# Patient Record
Sex: Male | Born: 1950 | Race: Black or African American | Hispanic: No | Marital: Married | Smoking: Current some day smoker
Health system: Southern US, Community
[De-identification: ages and names within clinical notes are randomized; demographics above are authoritative.]

## PROBLEM LIST (undated history)

## (undated) DIAGNOSIS — B019 Varicella without complication: Secondary | ICD-10-CM

## (undated) DIAGNOSIS — E785 Hyperlipidemia, unspecified: Secondary | ICD-10-CM

## (undated) DIAGNOSIS — C801 Malignant (primary) neoplasm, unspecified: Secondary | ICD-10-CM

## (undated) DIAGNOSIS — J449 Chronic obstructive pulmonary disease, unspecified: Secondary | ICD-10-CM

## (undated) DIAGNOSIS — I10 Essential (primary) hypertension: Secondary | ICD-10-CM

## (undated) HISTORY — DX: Essential (primary) hypertension: I10

## (undated) HISTORY — DX: Hyperlipidemia, unspecified: E78.5

## (undated) HISTORY — DX: Varicella without complication: B01.9

## (undated) HISTORY — DX: Chronic obstructive pulmonary disease, unspecified: J44.9

---

## 2004-10-03 ENCOUNTER — Ambulatory Visit: Payer: Self-pay | Admitting: Gastroenterology

## 2004-10-10 ENCOUNTER — Ambulatory Visit: Payer: Self-pay | Admitting: Gastroenterology

## 2004-10-20 ENCOUNTER — Ambulatory Visit: Payer: Self-pay | Admitting: Gastroenterology

## 2005-05-09 ENCOUNTER — Emergency Department (HOSPITAL_COMMUNITY): Admission: EM | Admit: 2005-05-09 | Discharge: 2005-05-09 | Payer: Self-pay | Admitting: Emergency Medicine

## 2007-09-18 HISTORY — PX: COLONOSCOPY: SHX174

## 2008-07-30 ENCOUNTER — Encounter: Admission: RE | Admit: 2008-07-30 | Discharge: 2008-07-30 | Payer: Self-pay | Admitting: Internal Medicine

## 2008-07-30 IMAGING — CT CT ABDOMEN WO/W CM
2 of 6 series · 13 of 46 positions shown, 18 images · IV contrast (READICAT/WATER & [ID] OMNI 300)
Comparison: None

CT ABDOMEN

CLINICAL DATA: History of pancreatic carcinoma, however the
patient gives no history of carcinoma. Now with weight loss and
left lower quadrant pain

CT ABDOMEN WITHOUT AND WITH CONTRAST
CT PELVIS WITH CONTRAST
TECHNIQUE: Multidetector CT imaging of the abdomen was performed
initially following the standard protocol before administration of
intravenous contrast.  Multidetector CT imaging of the abdomen and
pelvis was then performed following the standard protocol during
the bolus injection of intravenous contrast.
Contrast: 100 ml [KJ]

[Series 6: renal delay · axial · delayed · 0.70mm/px · z∈[-224,-74]mm · 10 of 36 slices shown, 15 images]
[im 3/36  soft-tissue]
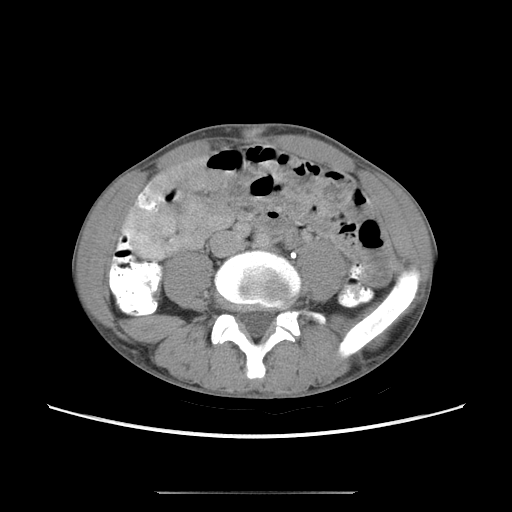
[im 3/36  bone]
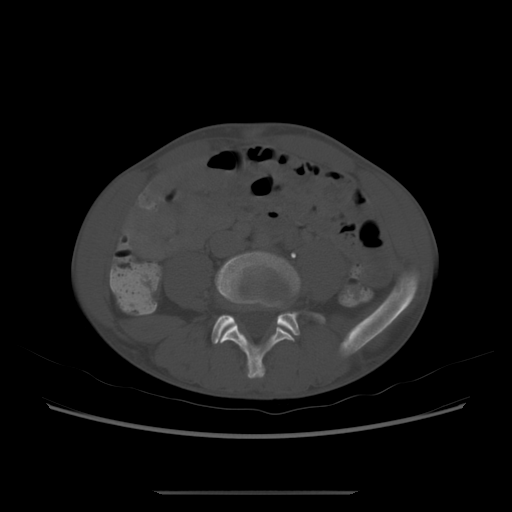
[im 8/36  soft-tissue]
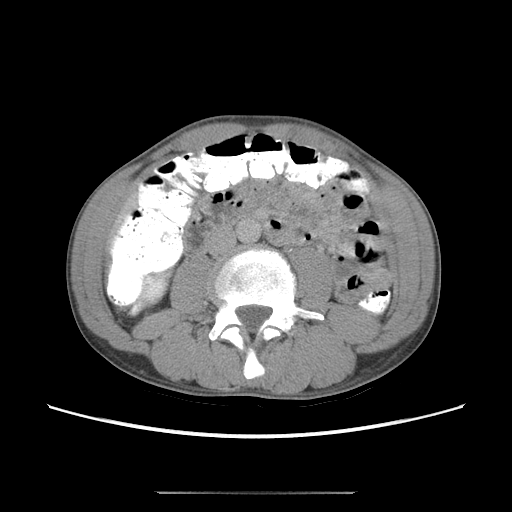
[im 10/36  soft-tissue]
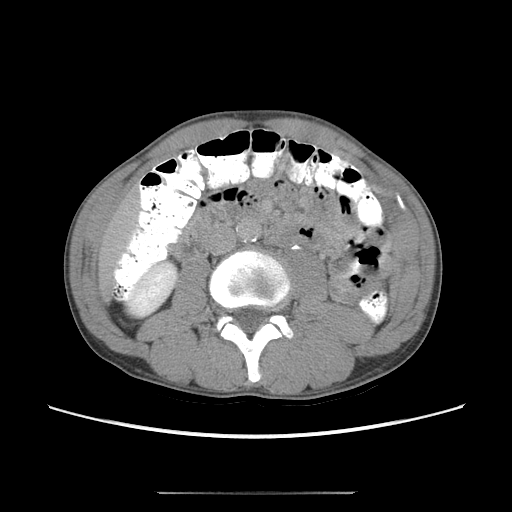
[im 15/36  soft-tissue]
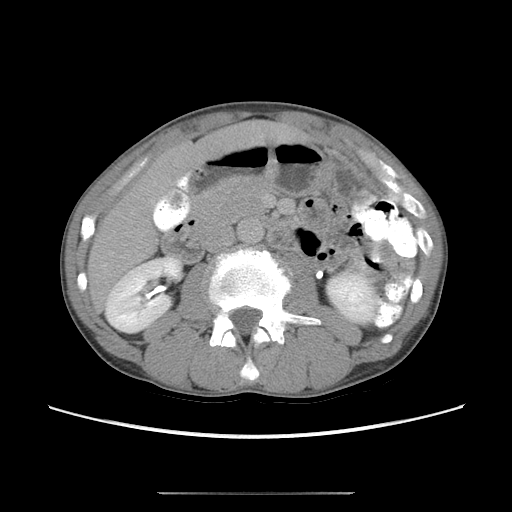
[im 19/36  soft-tissue]
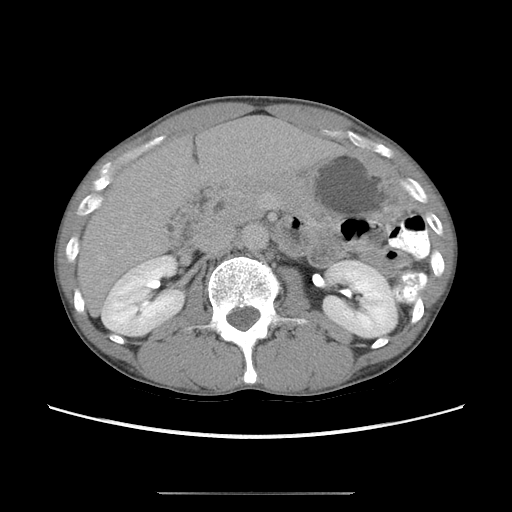
[im 22/36  soft-tissue]
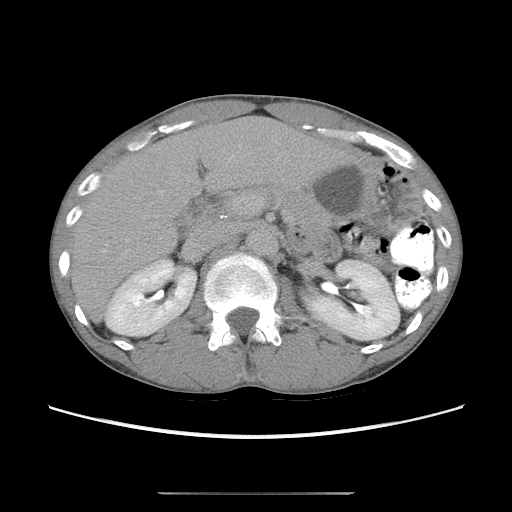
[im 26/36  soft-tissue]
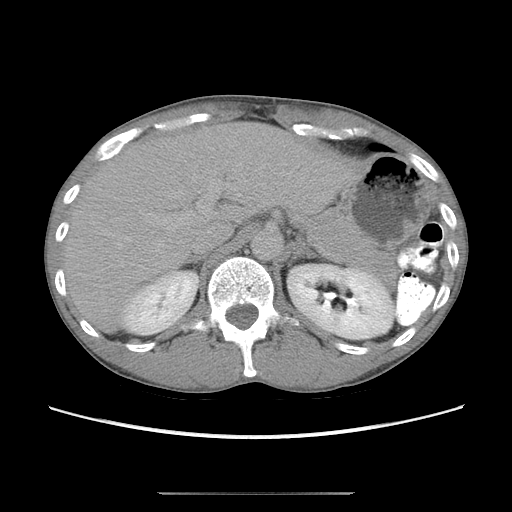
[im 26/36  lung]
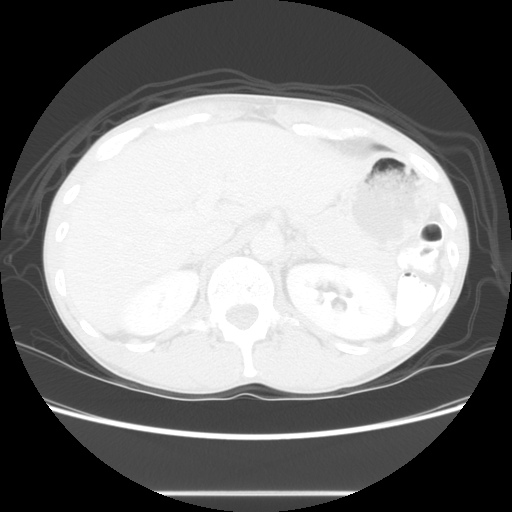
[im 29/36  soft-tissue]
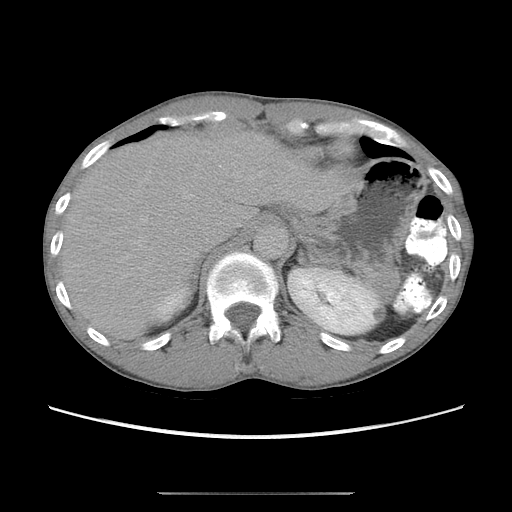
[im 29/36  lung]
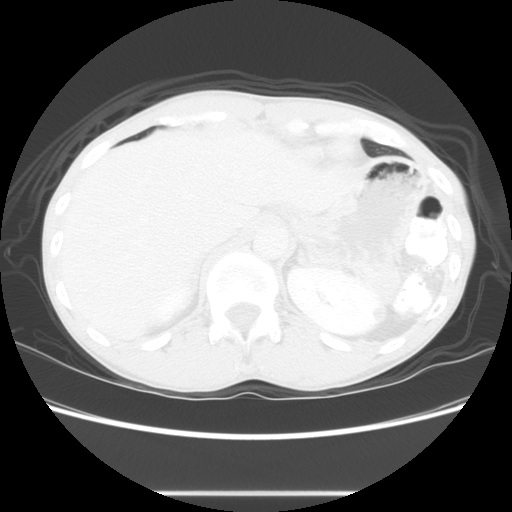
[im 31/36  lung]
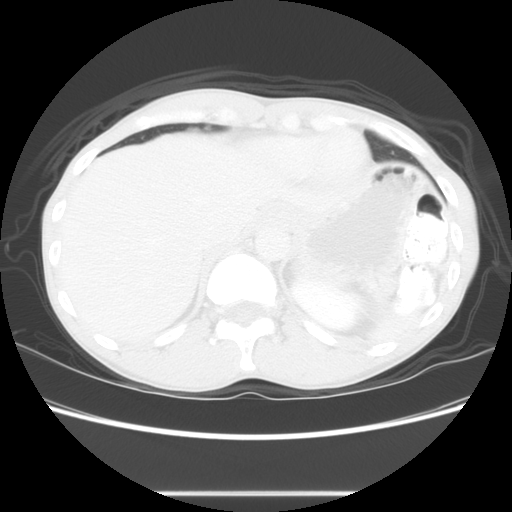
[im 33/36  soft-tissue]
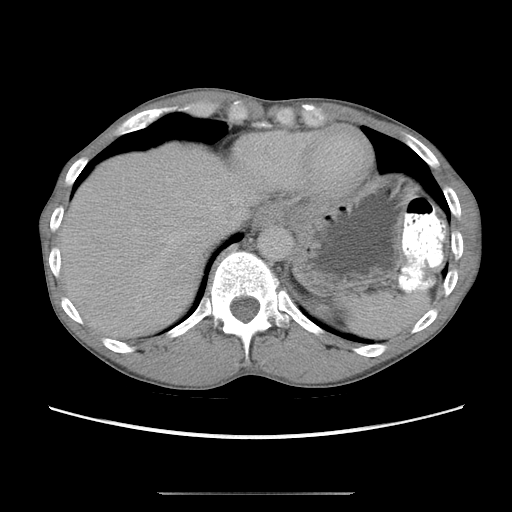
[im 33/36  lung]
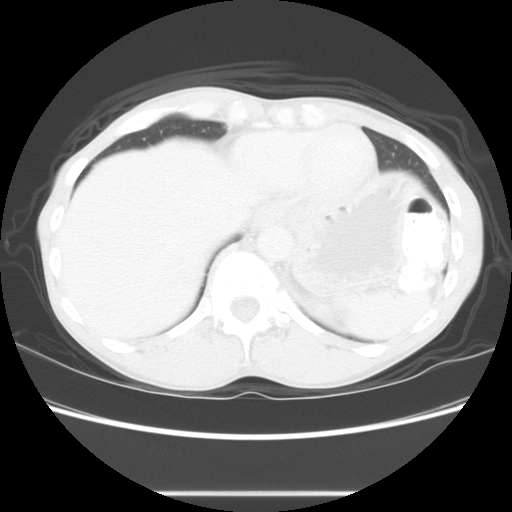
[im 33/36  bone]
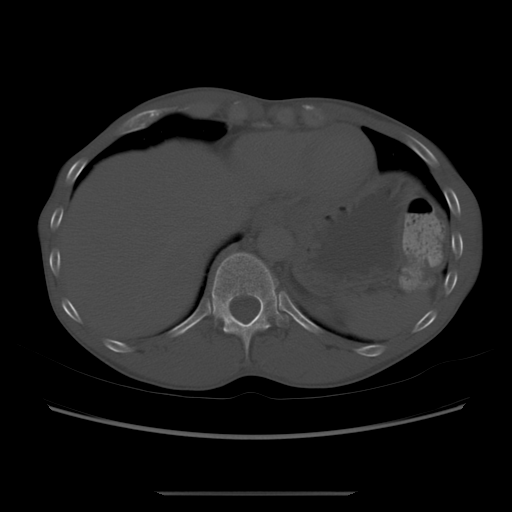

[Series 602: sagittal body · sagittal · 0.53mm/px · 3 of 109 slices shown]
[im 28/109  soft-tissue]
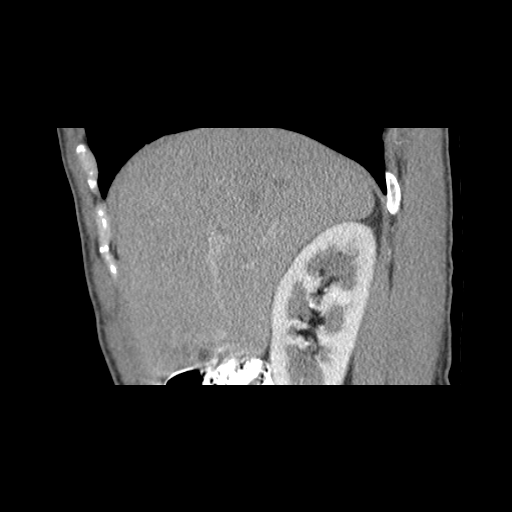
[im 55/109  soft-tissue]
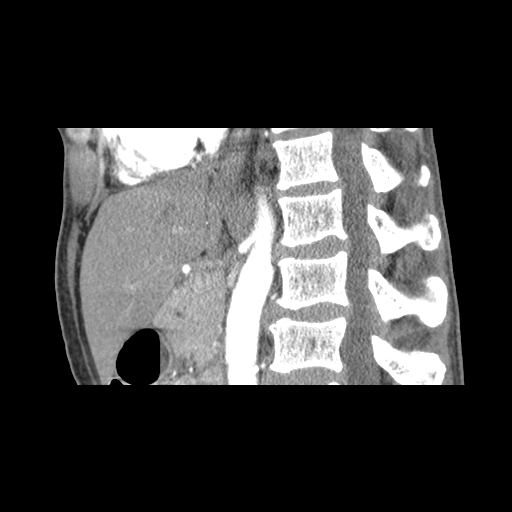
[im 82/109  soft-tissue]
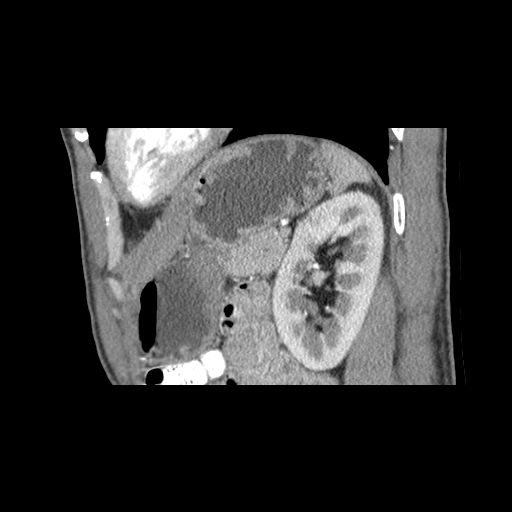

[13 of 46 positions shown; findings below may reference images not displayed]

FINDINGS: The lung bases are clear.  On the unenhanced study no
definite renal calculi are seen.

On the arterial phase the pancreas enhances homogeneously with no
focal abnormality.  The pancreatic duct is within normal limits in
size.  No pancreatic mass is seen.  The origins of the celiac axis,
SMA, both renal arteries and IMA appear patent.

On the portal venous phase the liver enhances with no suspicious
abnormality. The gallbladder is very contracted and elongated. The
pancreas again is homogeneous with no focal abnormality.  The
adrenal glands and spleen are within normal limits.  The kidneys
enhance and on delayed images the pelvocaliceal systems appear
normal.  There is apparent small cyst in the upper pole of the left
kidney posterolaterally.  The abdominal aorta is normal in caliber.
No adenopathy is seen.
IMPRESSION: 1.  No abnormality of the pancreas is seen.  No mass is noted and
there is no evidence of ductal dilatation.
2.  Gallbladder is contracted and elongated.

CT PELVIS
FINDINGS: There is very little peritoneal and retroperitoneal fat
present.  The appendix and terminal ileum appear normal.  The
urinary bladder is moderately well seen with no abnormality noted.
The prostate is normal in size for age.  No pelvic mass, fluid, or
adenopathy is seen.  No bony abnormality is noted.
IMPRESSION: Negative CT of the pelvis.

## 2008-09-15 ENCOUNTER — Ambulatory Visit (HOSPITAL_COMMUNITY): Admission: RE | Admit: 2008-09-15 | Discharge: 2008-09-15 | Payer: Self-pay | Admitting: *Deleted

## 2008-09-15 ENCOUNTER — Encounter (INDEPENDENT_AMBULATORY_CARE_PROVIDER_SITE_OTHER): Payer: Self-pay | Admitting: *Deleted

## 2009-08-24 DIAGNOSIS — D649 Anemia, unspecified: Secondary | ICD-10-CM | POA: Insufficient documentation

## 2009-08-24 DIAGNOSIS — R634 Abnormal weight loss: Secondary | ICD-10-CM | POA: Insufficient documentation

## 2009-08-25 ENCOUNTER — Ambulatory Visit: Payer: Self-pay | Admitting: Gastroenterology

## 2009-08-25 DIAGNOSIS — R1319 Other dysphagia: Secondary | ICD-10-CM

## 2009-08-25 DIAGNOSIS — K649 Unspecified hemorrhoids: Secondary | ICD-10-CM | POA: Insufficient documentation

## 2009-08-25 DIAGNOSIS — J4489 Other specified chronic obstructive pulmonary disease: Secondary | ICD-10-CM | POA: Insufficient documentation

## 2009-08-25 DIAGNOSIS — K219 Gastro-esophageal reflux disease without esophagitis: Secondary | ICD-10-CM

## 2009-08-25 DIAGNOSIS — J449 Chronic obstructive pulmonary disease, unspecified: Secondary | ICD-10-CM

## 2009-08-25 DIAGNOSIS — F1010 Alcohol abuse, uncomplicated: Secondary | ICD-10-CM | POA: Insufficient documentation

## 2009-08-26 LAB — CONVERTED CEMR LAB
Amylase: 293 units/L — ABNORMAL HIGH (ref 27–131)
INR: 1.1 — ABNORMAL HIGH (ref 0.8–1.0)
Iron: 132 ug/dL (ref 42–165)
Lipase: 33 units/L (ref 11.0–59.0)
Prothrombin Time: 11.2 s (ref 9.1–11.7)

## 2009-08-29 ENCOUNTER — Ambulatory Visit: Payer: Self-pay | Admitting: Gastroenterology

## 2011-01-30 NOTE — Op Note (Signed)
NAMEARSENIY, TOOMEY           ACCOUNT NO.:  [id number]   MEDICAL RECORD NO.:  [id number]          PATIENT TYPE:  AMB   LOCATION:  ENDO                         FACILITY:  [facility]   PHYSICIAN:  Georgiana Spinner, M.D.    DATE OF BIRTH:  01/16/1951   DATE OF PROCEDURE:  09/15/2008  DATE OF DISCHARGE:                               OPERATIVE REPORT   PROCEDURE:  Colonoscopy.   INDICATIONS:  Colon cancer screening.   ANESTHESIA:  Fentanyl 75 mcg, Versed 5 mg.   PROCEDURE:  With the patient mildly sedated in the left lateral  decubitus position, the Pentax videoscopic pediatric colonoscope was  inserted in the rectum and passed under direct vision with pressure  applied and the patient rolled to his back.  We passed through a very  tortuous sigmoid colon to reach the cecum identified by ileocecal valve  and appendiceal orifice, both of which were photographed.  From this  point, the colonoscope was slowly withdrawn, taking circumferential  views of colonic mucosa, stopping only in the rectum which appeared  normal on direct and retroflexed view.  The endoscope was straightened  and withdrawn.  The patient's vital signs and pulse oximeter remained  stable.  The patient tolerated the procedure well without apparent  complications.   FINDINGS:  Unremarkable examination but very tortuous colon.   PLAN:  Repeat examination in 5 or 10 years.           ______________________________  Georgiana Spinner, M.D.     GMO/MEDQ  D:  09/15/2008  T:  09/15/2008  Job:  [id number]

## 2015-05-19 ENCOUNTER — Encounter: Payer: Self-pay | Admitting: Gastroenterology

## 2016-05-02 DIAGNOSIS — N401 Enlarged prostate with lower urinary tract symptoms: Secondary | ICD-10-CM | POA: Diagnosis not present

## 2016-05-02 DIAGNOSIS — R350 Frequency of micturition: Secondary | ICD-10-CM | POA: Diagnosis not present

## 2016-05-02 DIAGNOSIS — N5201 Erectile dysfunction due to arterial insufficiency: Secondary | ICD-10-CM | POA: Diagnosis not present

## 2017-10-16 DIAGNOSIS — D649 Anemia, unspecified: Secondary | ICD-10-CM | POA: Diagnosis not present

## 2017-10-16 DIAGNOSIS — G629 Polyneuropathy, unspecified: Secondary | ICD-10-CM | POA: Diagnosis not present

## 2017-10-16 DIAGNOSIS — Z125 Encounter for screening for malignant neoplasm of prostate: Secondary | ICD-10-CM | POA: Diagnosis not present

## 2017-10-16 DIAGNOSIS — I739 Peripheral vascular disease, unspecified: Secondary | ICD-10-CM | POA: Diagnosis not present

## 2017-11-02 DIAGNOSIS — D649 Anemia, unspecified: Secondary | ICD-10-CM | POA: Diagnosis not present

## 2017-11-02 DIAGNOSIS — I7300 Raynaud's syndrome without gangrene: Secondary | ICD-10-CM | POA: Diagnosis not present

## 2017-11-02 DIAGNOSIS — G629 Polyneuropathy, unspecified: Secondary | ICD-10-CM | POA: Diagnosis not present

## 2017-11-27 DIAGNOSIS — M130 Polyarthritis, unspecified: Secondary | ICD-10-CM | POA: Diagnosis not present

## 2017-11-27 DIAGNOSIS — G629 Polyneuropathy, unspecified: Secondary | ICD-10-CM | POA: Diagnosis not present

## 2017-11-27 DIAGNOSIS — I7300 Raynaud's syndrome without gangrene: Secondary | ICD-10-CM | POA: Diagnosis not present

## 2017-11-27 DIAGNOSIS — D649 Anemia, unspecified: Secondary | ICD-10-CM | POA: Diagnosis not present

## 2017-12-27 DIAGNOSIS — I739 Peripheral vascular disease, unspecified: Secondary | ICD-10-CM | POA: Diagnosis not present

## 2017-12-27 DIAGNOSIS — I7300 Raynaud's syndrome without gangrene: Secondary | ICD-10-CM | POA: Diagnosis not present

## 2017-12-27 DIAGNOSIS — Z0000 Encounter for general adult medical examination without abnormal findings: Secondary | ICD-10-CM | POA: Diagnosis not present

## 2017-12-27 DIAGNOSIS — M130 Polyarthritis, unspecified: Secondary | ICD-10-CM | POA: Diagnosis not present

## 2017-12-27 DIAGNOSIS — G629 Polyneuropathy, unspecified: Secondary | ICD-10-CM | POA: Diagnosis not present

## 2018-02-06 DIAGNOSIS — R21 Rash and other nonspecific skin eruption: Secondary | ICD-10-CM | POA: Diagnosis not present

## 2018-02-06 DIAGNOSIS — R29898 Other symptoms and signs involving the musculoskeletal system: Secondary | ICD-10-CM | POA: Diagnosis not present

## 2018-02-06 DIAGNOSIS — Z6820 Body mass index (BMI) 20.0-20.9, adult: Secondary | ICD-10-CM | POA: Diagnosis not present

## 2018-02-06 DIAGNOSIS — M2550 Pain in unspecified joint: Secondary | ICD-10-CM | POA: Diagnosis not present

## 2018-02-06 DIAGNOSIS — I7300 Raynaud's syndrome without gangrene: Secondary | ICD-10-CM | POA: Diagnosis not present

## 2018-02-07 DIAGNOSIS — R21 Rash and other nonspecific skin eruption: Secondary | ICD-10-CM | POA: Diagnosis not present

## 2018-02-07 DIAGNOSIS — I7300 Raynaud's syndrome without gangrene: Secondary | ICD-10-CM | POA: Diagnosis not present

## 2018-02-07 DIAGNOSIS — F17210 Nicotine dependence, cigarettes, uncomplicated: Secondary | ICD-10-CM | POA: Diagnosis not present

## 2018-02-07 DIAGNOSIS — G629 Polyneuropathy, unspecified: Secondary | ICD-10-CM | POA: Diagnosis not present

## 2018-02-07 DIAGNOSIS — M130 Polyarthritis, unspecified: Secondary | ICD-10-CM | POA: Diagnosis not present

## 2019-10-05 DIAGNOSIS — Z03818 Encounter for observation for suspected exposure to other biological agents ruled out: Secondary | ICD-10-CM | POA: Diagnosis not present

## 2021-01-24 DIAGNOSIS — Z791 Long term (current) use of non-steroidal anti-inflammatories (NSAID): Secondary | ICD-10-CM | POA: Diagnosis not present

## 2021-01-24 DIAGNOSIS — M1990 Unspecified osteoarthritis, unspecified site: Secondary | ICD-10-CM | POA: Diagnosis not present

## 2021-01-24 DIAGNOSIS — R269 Unspecified abnormalities of gait and mobility: Secondary | ICD-10-CM | POA: Diagnosis not present

## 2021-01-24 DIAGNOSIS — Z87891 Personal history of nicotine dependence: Secondary | ICD-10-CM | POA: Diagnosis not present

## 2021-01-24 DIAGNOSIS — Z7722 Contact with and (suspected) exposure to environmental tobacco smoke (acute) (chronic): Secondary | ICD-10-CM | POA: Diagnosis not present

## 2021-01-24 DIAGNOSIS — G8929 Other chronic pain: Secondary | ICD-10-CM | POA: Diagnosis not present

## 2021-01-24 DIAGNOSIS — I10 Essential (primary) hypertension: Secondary | ICD-10-CM | POA: Diagnosis not present

## 2021-01-24 DIAGNOSIS — Z8249 Family history of ischemic heart disease and other diseases of the circulatory system: Secondary | ICD-10-CM | POA: Diagnosis not present

## 2021-04-06 ENCOUNTER — Ambulatory Visit (INDEPENDENT_AMBULATORY_CARE_PROVIDER_SITE_OTHER): Payer: Medicare HMO

## 2021-04-06 ENCOUNTER — Other Ambulatory Visit: Payer: Self-pay

## 2021-04-06 ENCOUNTER — Encounter: Payer: Self-pay | Admitting: Internal Medicine

## 2021-04-06 ENCOUNTER — Ambulatory Visit (INDEPENDENT_AMBULATORY_CARE_PROVIDER_SITE_OTHER): Payer: Medicare HMO | Admitting: Internal Medicine

## 2021-04-06 VITALS — BP 152/90 | HR 51 | Temp 98.6°F | Resp 16 | Ht 65.0 in | Wt 119.0 lb

## 2021-04-06 DIAGNOSIS — R058 Other specified cough: Secondary | ICD-10-CM

## 2021-04-06 DIAGNOSIS — E785 Hyperlipidemia, unspecified: Secondary | ICD-10-CM

## 2021-04-06 DIAGNOSIS — Z0001 Encounter for general adult medical examination with abnormal findings: Secondary | ICD-10-CM

## 2021-04-06 DIAGNOSIS — N401 Enlarged prostate with lower urinary tract symptoms: Secondary | ICD-10-CM | POA: Insufficient documentation

## 2021-04-06 DIAGNOSIS — R972 Elevated prostate specific antigen [PSA]: Secondary | ICD-10-CM | POA: Diagnosis not present

## 2021-04-06 DIAGNOSIS — Z1159 Encounter for screening for other viral diseases: Secondary | ICD-10-CM | POA: Diagnosis not present

## 2021-04-06 DIAGNOSIS — Z23 Encounter for immunization: Secondary | ICD-10-CM | POA: Diagnosis not present

## 2021-04-06 DIAGNOSIS — R053 Chronic cough: Secondary | ICD-10-CM | POA: Diagnosis not present

## 2021-04-06 DIAGNOSIS — R21 Rash and other nonspecific skin eruption: Secondary | ICD-10-CM

## 2021-04-06 DIAGNOSIS — J411 Mucopurulent chronic bronchitis: Secondary | ICD-10-CM

## 2021-04-06 DIAGNOSIS — R350 Frequency of micturition: Secondary | ICD-10-CM | POA: Diagnosis not present

## 2021-04-06 DIAGNOSIS — E559 Vitamin D deficiency, unspecified: Secondary | ICD-10-CM

## 2021-04-06 DIAGNOSIS — R001 Bradycardia, unspecified: Secondary | ICD-10-CM

## 2021-04-06 DIAGNOSIS — I739 Peripheral vascular disease, unspecified: Secondary | ICD-10-CM

## 2021-04-06 DIAGNOSIS — G629 Polyneuropathy, unspecified: Secondary | ICD-10-CM | POA: Diagnosis not present

## 2021-04-06 DIAGNOSIS — Z720 Tobacco use: Secondary | ICD-10-CM | POA: Insufficient documentation

## 2021-04-06 DIAGNOSIS — R059 Cough, unspecified: Secondary | ICD-10-CM | POA: Diagnosis not present

## 2021-04-06 DIAGNOSIS — Z1211 Encounter for screening for malignant neoplasm of colon: Secondary | ICD-10-CM

## 2021-04-06 DIAGNOSIS — I10 Essential (primary) hypertension: Secondary | ICD-10-CM | POA: Diagnosis not present

## 2021-04-06 DIAGNOSIS — J449 Chronic obstructive pulmonary disease, unspecified: Secondary | ICD-10-CM | POA: Diagnosis not present

## 2021-04-06 LAB — HEPATIC FUNCTION PANEL
ALT: 9 U/L (ref 0–53)
AST: 20 U/L (ref 0–37)
Albumin: 4.3 g/dL (ref 3.5–5.2)
Alkaline Phosphatase: 81 U/L (ref 39–117)
Bilirubin, Direct: 0.1 mg/dL (ref 0.0–0.3)
Total Bilirubin: 0.5 mg/dL (ref 0.2–1.2)
Total Protein: 7.2 g/dL (ref 6.0–8.3)

## 2021-04-06 LAB — BASIC METABOLIC PANEL
BUN: 12 mg/dL (ref 6–23)
CO2: 29 mEq/L (ref 19–32)
Calcium: 9.6 mg/dL (ref 8.4–10.5)
Chloride: 103 mEq/L (ref 96–112)
Creatinine, Ser: 0.96 mg/dL (ref 0.40–1.50)
GFR: 80.39 mL/min (ref >60.00)
Glucose, Bld: 79 mg/dL (ref 70–99)
Potassium: 4.4 mEq/L (ref 3.5–5.1)
Sodium: 141 mEq/L (ref 135–145)

## 2021-04-06 LAB — CBC WITH DIFFERENTIAL/PLATELET
Basophils Absolute: 0 10*3/uL (ref 0.0–0.1)
Basophils Relative: 0.8 % (ref 0.0–3.0)
Eosinophils Absolute: 0.1 10*3/uL (ref 0.0–0.7)
Eosinophils Relative: 4.5 % (ref 0.0–5.0)
HCT: 39.9 % (ref 39.0–52.0)
Hemoglobin: 13 g/dL (ref 13.0–17.0)
Lymphocytes Relative: 35.9 % (ref 12.0–46.0)
Lymphs Abs: 1.1 10*3/uL (ref 0.7–4.0)
MCHC: 32.7 g/dL (ref 30.0–36.0)
MCV: 95.8 fl (ref 78.0–100.0)
Monocytes Absolute: 0.3 10*3/uL (ref 0.1–1.0)
Monocytes Relative: 8.7 % (ref 3.0–12.0)
Neutro Abs: 1.5 10*3/uL (ref 1.4–7.7)
Neutrophils Relative %: 50.1 % (ref 43.0–77.0)
Platelets: 232 10*3/uL (ref 150.0–400.0)
RBC: 4.16 Mil/uL — ABNORMAL LOW (ref 4.22–5.81)
RDW: 13.5 % (ref 11.5–15.5)
WBC: 3.1 10*3/uL — ABNORMAL LOW (ref 4.0–10.5)

## 2021-04-06 LAB — LIPID PANEL
Cholesterol: 112 mg/dL (ref 0–200)
HDL: 54 mg/dL (ref >39.00)
LDL Cholesterol: 37 mg/dL (ref 0–99)
NonHDL: 57.97
Total CHOL/HDL Ratio: 2
Triglycerides: 105 mg/dL (ref 0.0–149.0)
VLDL: 21 mg/dL (ref 0.0–40.0)

## 2021-04-06 LAB — URINALYSIS, ROUTINE W REFLEX MICROSCOPIC
Bilirubin Urine: NEGATIVE
Ketones, ur: NEGATIVE
Leukocytes,Ua: NEGATIVE
Nitrite: POSITIVE — AB
Specific Gravity, Urine: 1.02 (ref 1.000–1.030)
Total Protein, Urine: NEGATIVE
Urine Glucose: NEGATIVE
Urobilinogen, UA: 1 (ref 0.0–1.0)
pH: 6.5 (ref 5.0–8.0)

## 2021-04-06 LAB — VITAMIN D 25 HYDROXY (VIT D DEFICIENCY, FRACTURES): VITD: 14.23 ng/mL — ABNORMAL LOW (ref 30.00–100.00)

## 2021-04-06 LAB — PSA: PSA: 6.1 ng/mL — ABNORMAL HIGH (ref 0.10–4.00)

## 2021-04-06 IMAGING — DX DG CHEST 2V
3 series · 3 of 3 positions shown · non-contrast
Comparison: CT abdomen [DATE]

CLINICAL DATA: Chronic cough, smoker, increased heart rate, no
known injury

EXAM:
CHEST - 2 VIEW

[chest pa (1 of 2)]
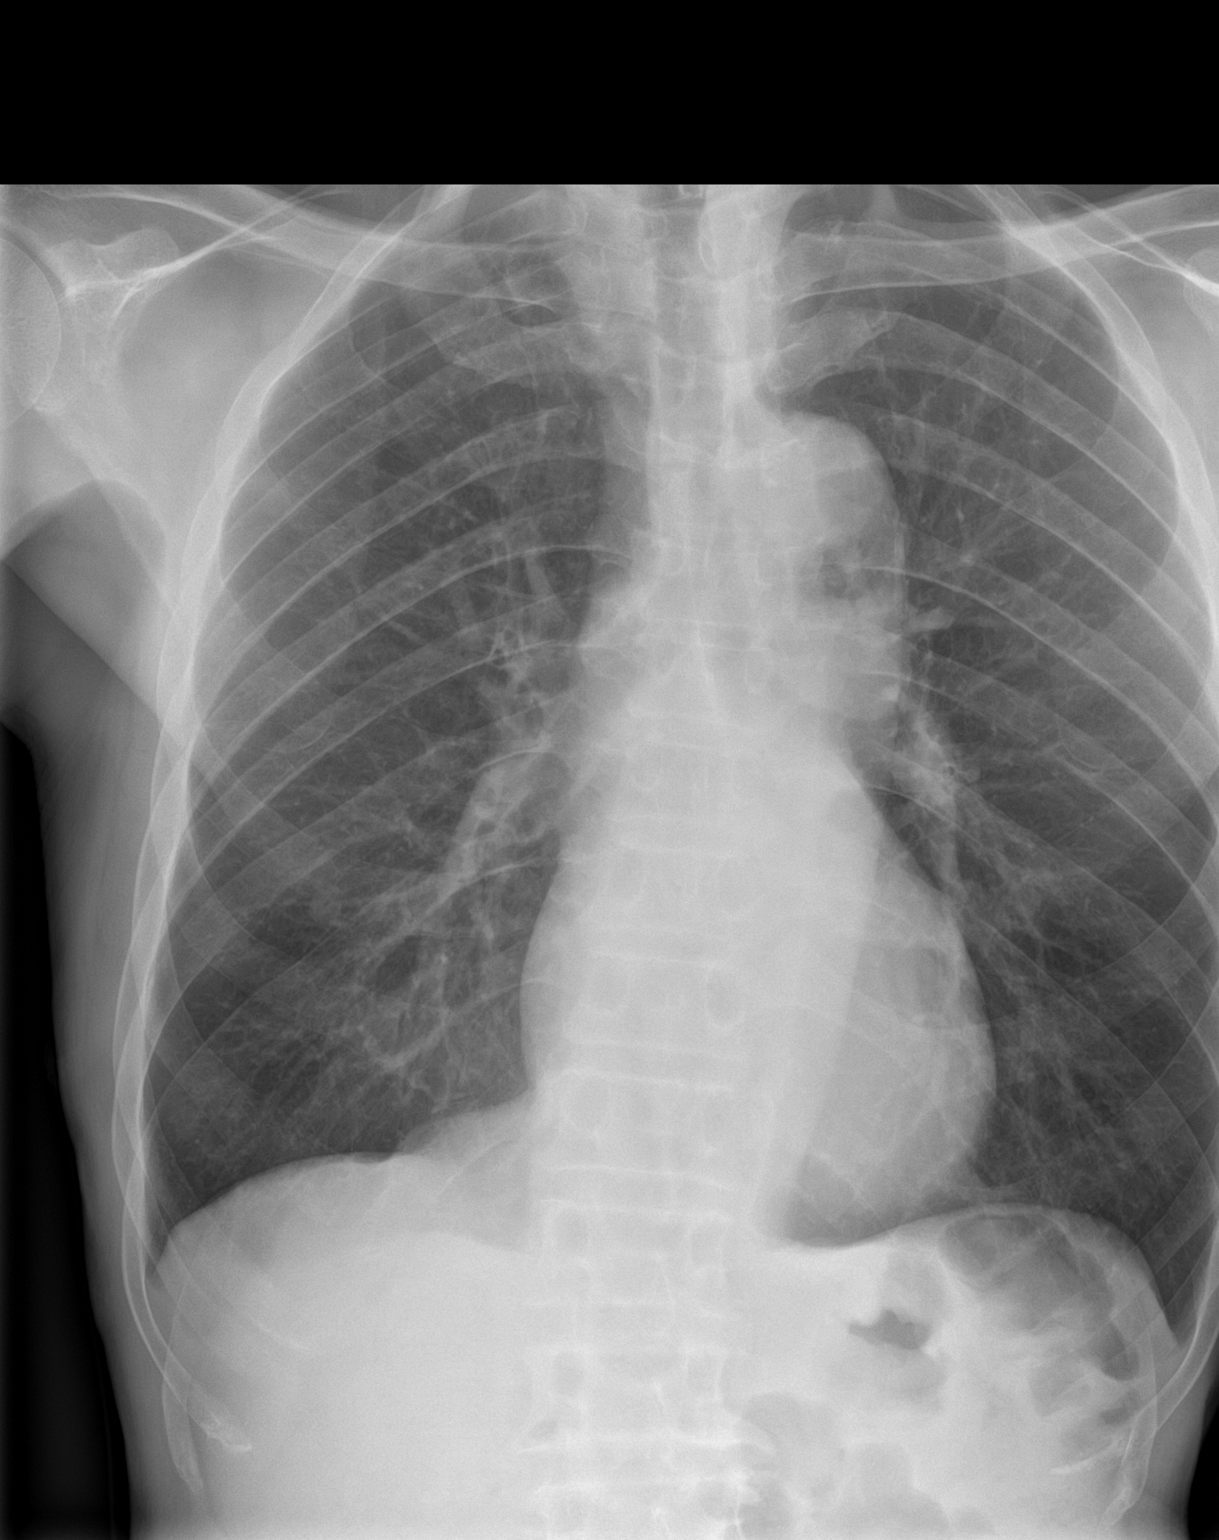

[chest pa (2 of 2)]
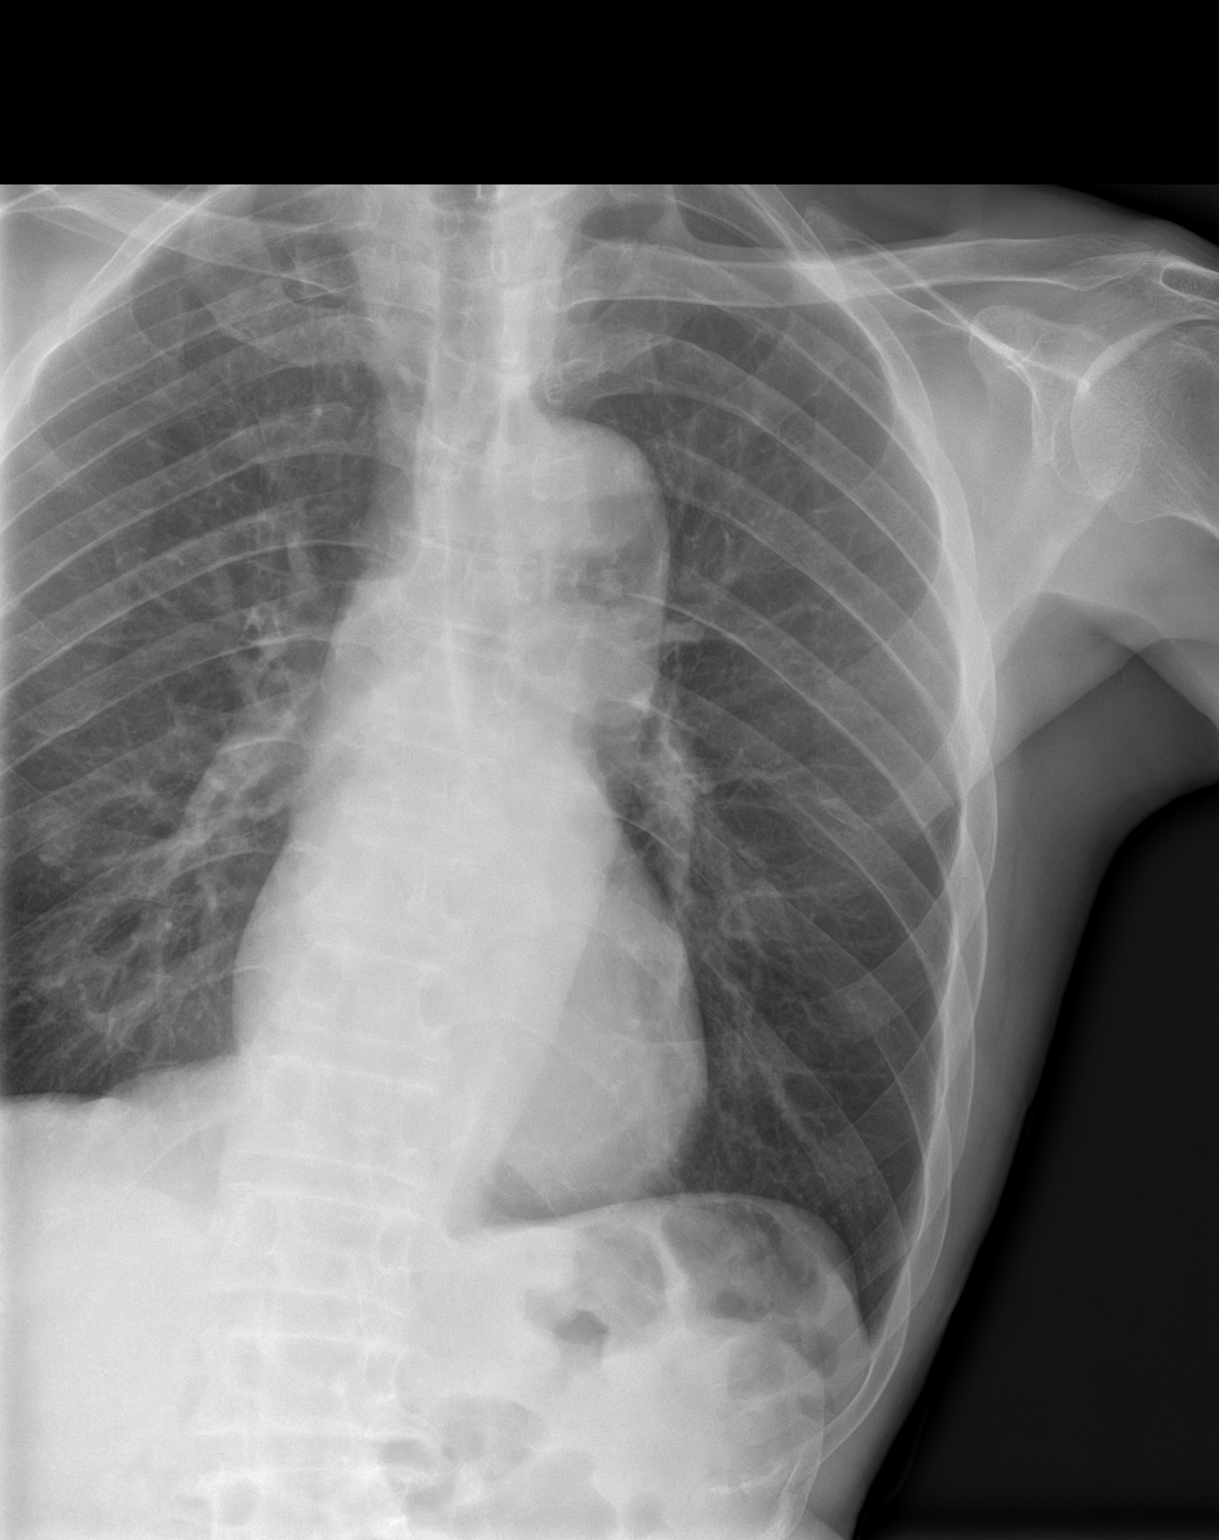

[chest lat]
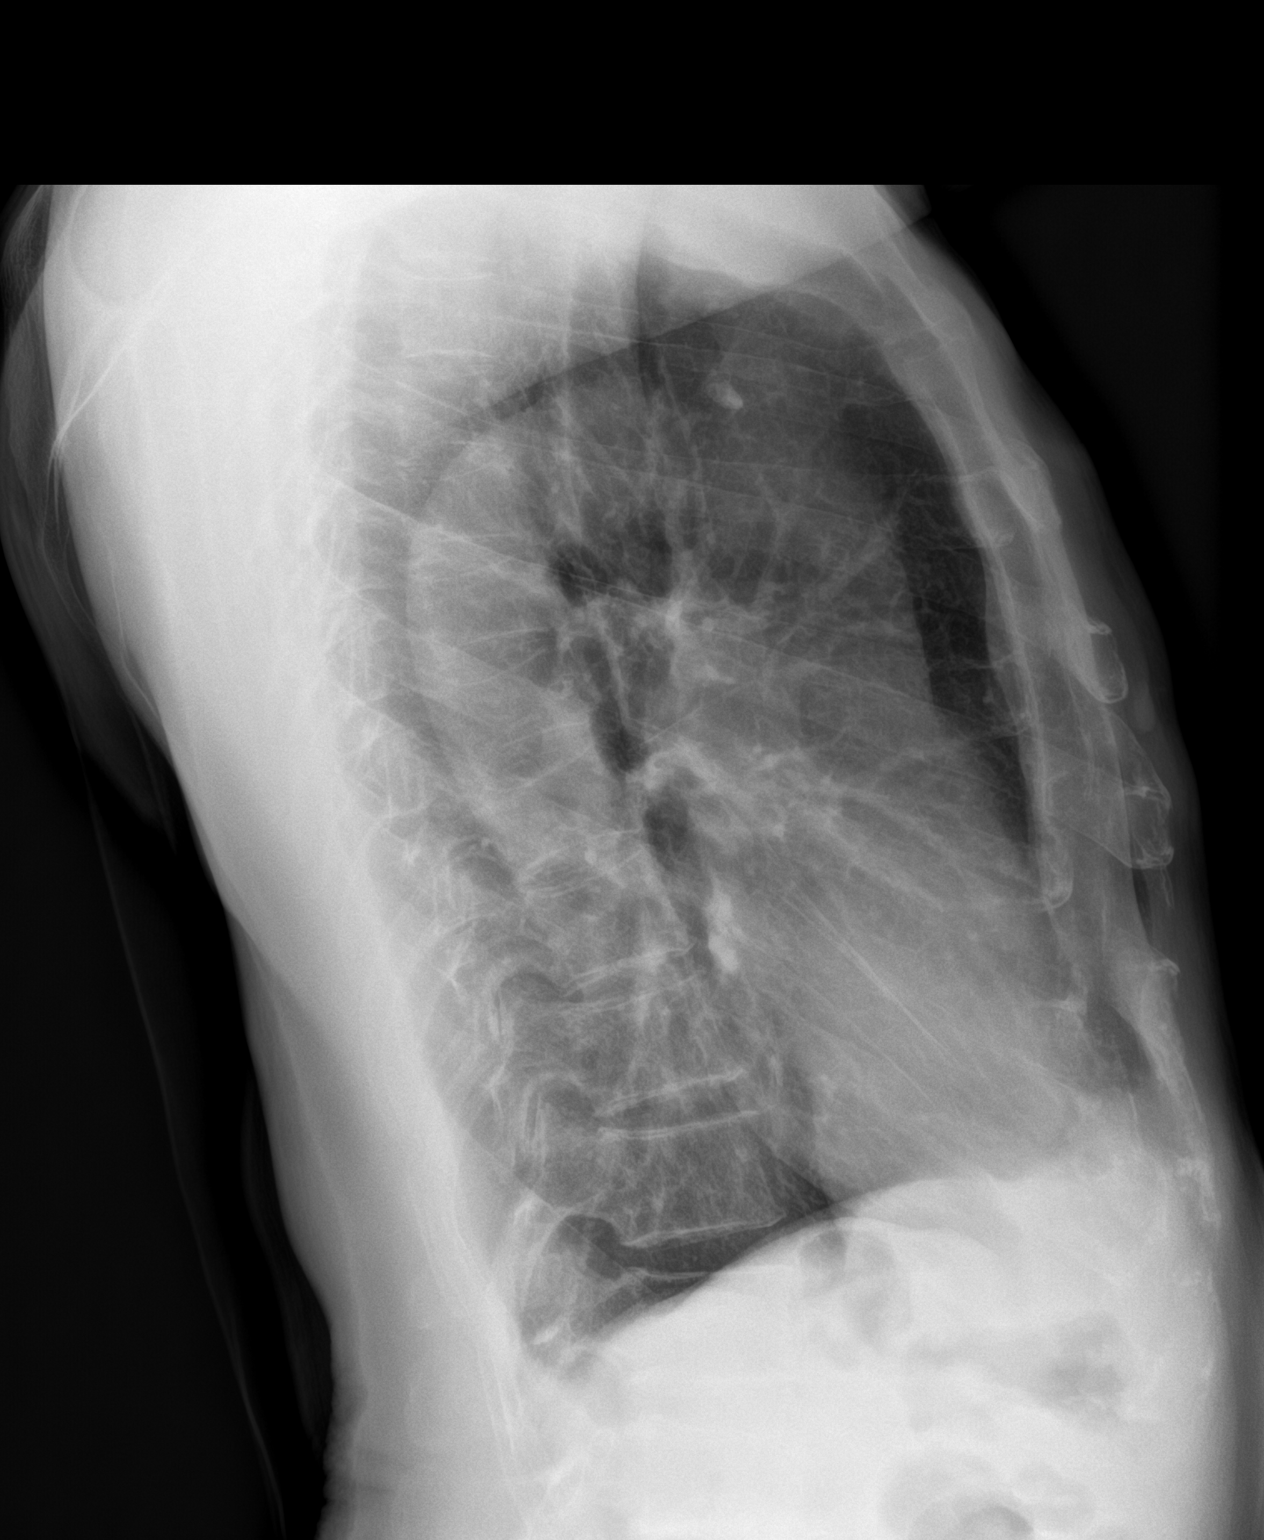

[3 of 3 positions shown; findings below may reference images not displayed]

FINDINGS: Lungs are hyperinflated as can be seen with COPD. No focal
consolidation. No pleural effusion or pneumothorax. Heart and
mediastinal contours are unremarkable.

No acute osseous abnormality.
IMPRESSION: No acute cardiopulmonary disease.

COPD.

## 2021-04-06 NOTE — Patient Instructions (Signed)

## 2021-04-06 NOTE — Progress Notes (Signed)
Subjective:  Patient ID: Keith [name], male    DOB: 04/20/1951  Age: 70 y.o. MRN: [id number]  CC: New Patient (Initial Visit), Annual Exam, Hypertension, Hyperlipidemia, Cough, and Rash  This visit occurred during the SARS-CoV-2 public health emergency.  Safety protocols were in place, including screening questions prior to the visit, additional usage of staff PPE, and extensive cleaning of exam room while observing appropriate contact time as indicated for disinfecting solutions.    HPI Keith [name] presents for a [facility], f/up, and to establish.  I have no records or information from his previous PCP.  He complains all new rash on his back for about 3 years that intermittently itches.  This has not responded to topical agents.  He also has a vague history of neuropathy and complains of chronic numbness and tingling in his lower extremities.  He complains the symptoms have worsened over the last year.  He has a chronic cough that is intermittently productive of yellow/clear phlegm.  He tells me that a month ago he had an elevated heart rate but since then has had no palpitations.  He does experience dyspnea on exertion but denies chest pain, diaphoresis, dizziness, lightheadedness, or near syncope.  He tells me that he can push his lawnmower without experiencing any chest pain.  He does complain of chronic lower extremity pain that sounds suspicious for claudication.  History Keith [name] has a past medical history of Chicken pox and Hypertension.   He has no past surgical history on file.   His family history includes Alcohol abuse in his brother and brother; Cancer in his brother and father; Heart attack in his father, mother, and sister; Hypertension in his mother.He reports that he has been smoking cigarettes. He started smoking about 52 years ago. He has a 20.00 pack-year smoking history. He has never used smokeless tobacco. He reports current alcohol use of about 2.0 standard drinks of  alcohol per week. He reports that he does not use drugs.  Outpatient Medications Prior to Visit  Medication Sig Dispense Refill   clotrimazole-betamethasone (LOTRISONE) cream Apply 1 application topically 2 (two) times daily.     gabapentin (NEURONTIN) 300 MG capsule Take 300 mg by mouth 2 (two) times daily.     etodolac (LODINE) 400 MG tablet Take 400 mg by mouth 2 (two) times daily.     naproxen (NAPROSYN) 500 MG tablet Take 500 mg by mouth 2 (two) times daily with a meal.     verapamil (VERELAN PM) 180 MG 24 hr capsule Take 180 mg by mouth at bedtime.     No facility-administered medications prior to visit.    ROS Review of Systems  Constitutional:  Positive for unexpected weight change (wt loss). Negative for appetite change, chills, diaphoresis and fatigue.  HENT: Negative.    Eyes: Negative.   Respiratory:  Positive for cough and shortness of breath. Negative for choking, chest tightness and wheezing.   Cardiovascular:  Positive for palpitations. Negative for chest pain and leg swelling.  Gastrointestinal:  Negative for abdominal pain, diarrhea, nausea and vomiting.  Endocrine: Negative.   Genitourinary: Negative.  Negative for difficulty urinating, dysuria, hematuria, testicular pain and urgency.  Musculoskeletal: Negative.   Skin: Negative.   Neurological:  Negative for dizziness, syncope, weakness and numbness.  Hematological:  Negative for adenopathy. Does not bruise/bleed easily.  Psychiatric/Behavioral: Negative.     Objective:  BP (!) 152/90 (BP Location: Right Arm, Patient Position: Sitting, Cuff Size: Normal)   Pulse (!) 51  Temp 98.6 F (37 C) (Oral)   Resp 16   Ht 5\' 5"  (1.651 m)   Wt 119 lb (54 kg)   SpO2 100%   BMI 19.80 kg/m   Physical Exam Vitals reviewed.  Constitutional:      Appearance: Normal appearance. He is underweight.  HENT:     Mouth/Throat:     Mouth: Mucous membranes are moist.  Eyes:     Conjunctiva/sclera: Conjunctivae normal.   Cardiovascular:     Rate and Rhythm: Regular rhythm. Bradycardia present.     Heart sounds: No murmur heard.   No gallop.     Comments: EKG- Sinus bradycardia, 49 bpm Otherwise normal EKG Pulmonary:     Effort: Pulmonary effort is normal.     Breath sounds: No stridor. No wheezing, rhonchi or rales.  Abdominal:     General: Abdomen is flat.     Palpations: There is no mass.     Tenderness: There is no abdominal tenderness. There is no guarding.     Hernia: There is no hernia in the left inguinal area or right inguinal area.  Genitourinary:    Pubic Area: No rash.      Penis: Normal and uncircumcised.      Testes: Normal.     Epididymis:     Right: Normal.     Left: Normal.     Prostate: Enlarged. Not tender and no nodules present.     Rectum: Normal. Guaiac result negative. No mass, tenderness, anal fissure, external hemorrhoid or internal hemorrhoid. Normal anal tone.  Musculoskeletal:        General: Normal range of motion.     Cervical back: Neck supple.  Lymphadenopathy:     Cervical: No cervical adenopathy.     Lower Body: No right inguinal adenopathy. No left inguinal adenopathy.  Skin:    General: Skin is warm.     Coloration: Skin is not jaundiced.     Findings: Rash present. No lesion.     Comments: TNTC lesions over his back.  They have a stuck on appearance and are black and hyperpigmented.  These look suspicious for SKs.  See photo.  Neurological:     General: No focal deficit present.     Mental Status: He is alert.  Psychiatric:        Mood and Affect: Mood normal.        Behavior: Behavior normal.    Lab Results  Component Value Date   WBC 3.1 (L) 04/06/2021   HGB 13.0 04/06/2021   HCT 39.9 04/06/2021   PLT 232.0 04/06/2021   GLUCOSE 79 04/06/2021   CHOL 112 04/06/2021   TRIG 105.0 04/06/2021   HDL 54.00 04/06/2021   LDLCALC 37 04/06/2021   ALT 9 04/06/2021   AST 20 04/06/2021   NA 141 04/06/2021   K 4.4 04/06/2021   CL 103 04/06/2021    CREATININE 0.96 04/06/2021   BUN 12 04/06/2021   CO2 29 04/06/2021   TSH 2.57 04/06/2021   PSA 6.10 (H) 04/06/2021   INR 1.1 ratio (H) 08/25/2009    DG Chest 2 View  Result Date: 04/07/2021 CLINICAL DATA:  Chronic cough, smoker, increased heart rate, no known injury EXAM: CHEST - 2 VIEW COMPARISON:  CT abdomen 07/30/2008 FINDINGS: Lungs are hyperinflated as can be seen with COPD. No focal consolidation. No pleural effusion or pneumothorax. Heart and mediastinal contours are unremarkable. No acute osseous abnormality. IMPRESSION: No acute cardiopulmonary disease. COPD. Electronically Signed  By: Kathreen Devoid   On: 04/07/2021 08:50     Assessment & Plan:   Keith [name] was seen today for new patient (initial visit), annual exam, hypertension, hyperlipidemia, cough and rash.  Diagnoses and all orders for this visit:  Mucopurulent chronic bronchitis ([place])- See below.  Primary hypertension- He has not achieved his blood pressure goal of 130/80.  Labs are negative for secondary causes or endorgan damage.  I recommended that he start taking indapamide. -     EKG 12-Lead -     Basic metabolic panel; Future -     Urinalysis, Routine w reflex microscopic; Future -     VITAMIN D 25 Hydroxy (Vit-D Deficiency, Fractures); Future -     Thyroid Panel With TSH; Future -     CBC with Differential/Platelet; Future -     CBC with Differential/Platelet -     Thyroid Panel With TSH -     VITAMIN D 25 Hydroxy (Vit-D Deficiency, Fractures) -     Urinalysis, Routine w reflex microscopic -     Basic metabolic panel -     indapamide (LOZOL) 1.25 MG tablet; Take 1 tablet (1.25 mg total) by mouth daily.  Tobacco abuse -     Ambulatory Referral for Lung Cancer Scre  Abnormal physical evaluation- Exam completed, labs reviewed, vaccines reviewed and updated, cancer screenings addressed, patient education material was given.  Screen for colon cancer -     Ambulatory referral to Gastroenterology  Rash of back-  Labs are negative for secondary causes.  I have asked him to see dermatology about this. -     RPR; Future -     HIV Antibody (routine testing w rflx); Future -     HIV Antibody (routine testing w rflx) -     RPR -     Ambulatory referral to Dermatology  Bradycardia, sinus- Labs are negative for secondary causes.  He complains of DOE so I have asked him to see cardiology to have this evaluated. -     Thyroid Panel With TSH; Future -     Ambulatory referral to Cardiology -     Thyroid Panel With TSH  Benign prostatic hyperplasia with urinary frequency- His PSA is elevated but he is asymptomatic. -     Urinalysis, Routine w reflex microscopic; Future -     PSA; Future -     PSA -     Urinalysis, Routine w reflex microscopic  Dyslipidemia, goal LDL below 100- He has an elevated ASCVD risk score so I have asked him to take a statin for CV risk reduction. -     Lipid panel; Future -     Hepatic function panel; Future -     Hepatic function panel -     Lipid panel -     rosuvastatin (CRESTOR) 5 MG tablet; Take 1 tablet (5 mg total) by mouth daily.  Need for hepatitis C screening test -     Hepatitis C antibody; Future -     Hepatitis C antibody  Cough with sputum- His chest x-ray is negative for mass or infiltrate.  His symptoms are consistent with COPD.  He is not willing to use an inhaler for this. 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  PSA elevation -     Ambulatory referral to Urology  I have discontinued Ty Robards's naproxen, verapamil, and etodolac. I am also having him start on Cholecalciferol, rosuvastatin, and  indapamide. Additionally, I am having him maintain his gabapentin and clotrimazole-betamethasone.  Meds ordered this encounter  Medications   Cholecalciferol 1.25 MG (50000 UT) capsule    Sig: Take 1 capsule (50,000 Units total) by mouth once a week.    Dispense:  12 capsule    Refill:  1   rosuvastatin (CRESTOR) 5 MG tablet    Sig: Take 1 tablet (5 mg total) by mouth daily.    Dispense:  90 tablet    Refill:  1   indapamide (LOZOL) 1.25 MG tablet    Sig: Take 1 tablet (1.25 mg total) by mouth daily.    Dispense:  90 tablet    Refill:  0      Follow-up: Return in about 3 months (around 07/07/2021).  Scarlette Calico, MD

## 2021-04-07 ENCOUNTER — Encounter: Payer: Self-pay | Admitting: Internal Medicine

## 2021-04-07 ENCOUNTER — Telehealth: Payer: Self-pay | Admitting: Internal Medicine

## 2021-04-07 DIAGNOSIS — R972 Elevated prostate specific antigen [PSA]: Secondary | ICD-10-CM | POA: Insufficient documentation

## 2021-04-07 DIAGNOSIS — E559 Vitamin D deficiency, unspecified: Secondary | ICD-10-CM | POA: Insufficient documentation

## 2021-04-07 LAB — HIV ANTIBODY (ROUTINE TESTING W REFLEX): HIV 1&2 Ab, 4th Generation: NONREACTIVE

## 2021-04-07 LAB — THYROID PANEL WITH TSH
Free Thyroxine Index: 1.9 (ref 1.4–3.8)
T3 Uptake: 30 % (ref 22–35)
T4, Total: 6.2 ug/dL (ref 4.9–10.5)
TSH: 2.57 mIU/L (ref 0.40–4.50)

## 2021-04-07 LAB — HEPATITIS C ANTIBODY
Hepatitis C Ab: NONREACTIVE
SIGNAL TO CUT-OFF: 0.03 (ref <1.00)

## 2021-04-07 LAB — RPR: RPR Ser Ql: NONREACTIVE

## 2021-04-07 MED ORDER — ROSUVASTATIN CALCIUM 5 MG PO TABS: 5.0000 mg | ORAL_TABLET | Freq: Every day | ORAL | 1 refills | Status: DC

## 2021-04-07 MED ORDER — CHOLECALCIFEROL 1.25 MG (50000 UT) PO CAPS: 50000.0000 [IU] | ORAL_CAPSULE | ORAL | 1 refills | Status: DC

## 2021-04-07 NOTE — Telephone Encounter (Signed)
Patient calling to request medication be sent to local pharmacy   Cholecalciferol 1.25 MG (50000 UT) capsule  rosuvastatin (CRESTOR) 5 MG tablet  Pharmacy [facility] [place], [place]

## 2021-04-08 ENCOUNTER — Encounter: Payer: Self-pay | Admitting: Internal Medicine

## 2021-04-08 DIAGNOSIS — Z0001 Encounter for general adult medical examination with abnormal findings: Secondary | ICD-10-CM | POA: Insufficient documentation

## 2021-04-08 MED ORDER — INDAPAMIDE 1.25 MG PO TABS: 1.2500 mg | ORAL_TABLET | Freq: Every day | ORAL | 0 refills | Status: DC

## 2021-04-10 ENCOUNTER — Other Ambulatory Visit: Payer: Self-pay | Admitting: Internal Medicine

## 2021-04-10 DIAGNOSIS — E785 Hyperlipidemia, unspecified: Secondary | ICD-10-CM

## 2021-04-10 DIAGNOSIS — I10 Essential (primary) hypertension: Secondary | ICD-10-CM

## 2021-04-10 DIAGNOSIS — E559 Vitamin D deficiency, unspecified: Secondary | ICD-10-CM

## 2021-04-10 MED ORDER — ROSUVASTATIN CALCIUM 5 MG PO TABS: 5.0000 mg | ORAL_TABLET | Freq: Every day | ORAL | 1 refills | Status: DC

## 2021-04-10 MED ORDER — INDAPAMIDE 1.25 MG PO TABS: 1.2500 mg | ORAL_TABLET | Freq: Every day | ORAL | 0 refills | Status: DC

## 2021-04-10 MED ORDER — CHOLECALCIFEROL 1.25 MG (50000 UT) PO CAPS: 50000.0000 [IU] | ORAL_CAPSULE | ORAL | 1 refills | Status: DC

## 2021-04-12 ENCOUNTER — Ambulatory Visit (HOSPITAL_COMMUNITY)
Admission: RE | Admit: 2021-04-12 | Discharge: 2021-04-12 | Disposition: A | Discharge status: Home / Self Care | Payer: Medicare HMO | Source: Ambulatory Visit | Attending: Internal Medicine | Admitting: Internal Medicine

## 2021-04-12 ENCOUNTER — Other Ambulatory Visit: Payer: Self-pay

## 2021-04-12 DIAGNOSIS — I739 Peripheral vascular disease, unspecified: Secondary | ICD-10-CM

## 2021-04-13 ENCOUNTER — Telehealth: Payer: Self-pay | Admitting: Internal Medicine

## 2021-04-13 ENCOUNTER — Encounter: Payer: Self-pay | Admitting: Gastroenterology

## 2021-04-13 NOTE — Telephone Encounter (Signed)
   Patient seeking advice  He would like to know what is the target range for his blood pressure. Also he has questions about newly prescribed medications. Patient is unsure why each medication was prescribed  Please call

## 2021-04-14 NOTE — Chronic Care Management (AMB) (Signed)
Spoke with patient   Patient reports that he was started on new medications with his last appointment with PCP and was uncertain about the changes made  Indapamide  1.53m daily was started with last visit  Previously had been taking verapamil (prescribed by last PCP) - established with Dr. JRonnald Ramp7/21/2022  Per OV note, patient advised to stop verapamil and start indapamide instead   Patient also to stop naproxen and etodolac    Hypertension  (Status:New goal.)   Med Management Intervention:  Confirmed medication changed from PCP appointment last week  (BP goal <140/90 ideally <130/80) -Uncontrolled -Current treatment: Indapamide 1.243m- 1 tablet daily  -Medications previously tried: verapamil - caused bradycardia - advised to stop with most recent PCP visit   -Current home readings: none at this time, but are going to go to pick up a blood pressure cuff  -Current dietary habits: did not assessed with phone call today, will complete with initial appointment -Current exercise habits: not assessed with phone call today will complete with initial appointment -Denies hypotensive/hypertensive symptoms -Educated on BP goals and benefits of medications for prevention of heart attack, stroke and kidney damage; Importance of home blood pressure monitoring; Proper BP monitoring technique; Symptoms of hypotension and importance of maintaining adequate hydration; -Counseled to monitor BP at home daily, document, and provide log at future appointments -Reviewed with patient changes to medications -confirmed with patient and wife that he is to hold the naproxen (as this could be causing increases in BP) and that he is to continue taking rosuvastatin 63m81maily, gabapentin 300m70mice daily, and vitamin D3 50,0000 units once weekly   Care Management  Note   04/14/2021 Name: DonnTara Wich: [id number]: 7/30Feb 09, 1952nn[birth year]ishaun Levelsa 69 y51. year old male who is a primary care  patient of JoneJanith Lima. The care management team was consulted for assistance with chronic disease management and care coordination needs.   Mr. SturDebruyne given information about Care Management services today including:  CCM service includes personalized support from designated clinical staff supervised by the physician, including individualized plan of care and coordination with other care providers 24/7 contact phone numbers for assistance for urgent and routine care needs. Service will only be billed when office clinical staff spend 20 minutes or more in a month to coordinate care. Only one practitioner may furnish and bill the service in a calendar month. The patient may stop CCM services at amy time (effective at the end of the month) by phone call to the office staff. The patient will be responsible for cost sharing (co-pay) or up to 20% of the service fee (after annual deductible is met)  Patient agreed to services and verbal consent obtained.  Follow up plan:   Telephone follow up appointment with care management team member scheduled for:  2 weeks  and The patient has been provided with contact information for the care management team and has been advised to call with any health related questions or concerns.   DaniTomasa BlasearmD Clinical Pharmacist, [place]/29/22 12:22 PM   Patient's preferred contact number: [phone]

## 2021-04-21 ENCOUNTER — Encounter: Payer: Self-pay | Admitting: Neurology

## 2021-04-24 ENCOUNTER — Telehealth: Payer: Self-pay

## 2021-04-24 NOTE — Chronic Care Management (AMB) (Signed)
    Chronic Care Management Pharmacy Assistant   Name: Keith [name]  MRN: [id number] DOB: 04/18/1951  Keith [name] is an 70 y.o. year old male who presents for his initial CCM visit with the clinical pharmacist.  Recent office visits:  04/06/21 Keith Calico MD(PCP)- Patient was seen for Mucopurulent Chronic Bronchitis and his annual exam. Labs and xrays were ordered. Referrals for Gastro and Lung cancer screenings were placed. Patient started Cholecalciferol 50.000 units weekly, Indapamide 1.25 mg daily, and Rosuvastatin 5 MG daily. Etodolac, Naproxen, and verapamil were discontinued. Pneumonia vaccine was given  Recent consult visits:  None noted  Hospital visits:  None in previous 6 months  Medications: Outpatient Encounter Medications as of 04/24/2021  Medication Sig   Cholecalciferol 1.25 MG (50000 UT) capsule Take 1 capsule (50,000 Units total) by mouth once a week.   gabapentin (NEURONTIN) 300 MG capsule Take 300 mg by mouth 2 (two) times daily.   indapamide (LOZOL) 1.25 MG tablet Take 1 tablet (1.25 mg total) by mouth daily.   rosuvastatin (CRESTOR) 5 MG tablet Take 1 tablet (5 mg total) by mouth daily.   No facility-administered encounter medications on file as of 04/24/2021.    Current Medication List Cholecalciferol 1.25 MG (50000 UT) capsule last filled 04/10/21 12 DS gabapentin (NEURONTIN) 300 MG capsule last filled 04/10/21  indapamide (LOZOL) 1.25 MG tablet last filled 04/10/21 90 DS rosuvastatin (CRESTOR) 5 MG tablet last filled 04/10/21 90 DS    Keith [name] Clinical Pharmacist Assistant [phone]

## 2021-04-25 ENCOUNTER — Telehealth: Payer: Self-pay | Admitting: Internal Medicine

## 2021-04-25 ENCOUNTER — Other Ambulatory Visit: Payer: Self-pay | Admitting: Internal Medicine

## 2021-04-25 DIAGNOSIS — G629 Polyneuropathy, unspecified: Secondary | ICD-10-CM

## 2021-04-25 MED ORDER — GABAPENTIN 300 MG PO CAPS: 300.0000 mg | ORAL_CAPSULE | Freq: Two times a day (BID) | ORAL | 1 refills | Status: DC

## 2021-04-25 NOTE — Telephone Encounter (Signed)
   Patient requesting refill for gabapentin (NEURONTIN) 300 MG capsule  Pharmacy [facility] [place], [place]

## 2021-04-28 ENCOUNTER — Ambulatory Visit: Payer: Self-pay

## 2021-04-28 ENCOUNTER — Other Ambulatory Visit: Payer: Self-pay

## 2021-04-28 DIAGNOSIS — G629 Polyneuropathy, unspecified: Secondary | ICD-10-CM

## 2021-04-28 DIAGNOSIS — I10 Essential (primary) hypertension: Secondary | ICD-10-CM

## 2021-04-28 NOTE — Patient Instructions (Signed)
Visit Information   PATIENT GOALS:   Goals Addressed             This Visit's Progress    Track and Manage My Blood Pressure-Hypertension       Timeframe:  Long-Range Goal Priority:  High Start Date:   04/28/2021                          Expected End Date:  10/29/2021                     Follow Up Date 07/29/2021   - check blood pressure daily - choose a place to take my blood pressure (home, clinic or office, retail store) - write blood pressure results in a log or diary    Why is this important?   You won't feel high blood pressure, but it can still hurt your blood vessels.  High blood pressure can cause heart or kidney problems. It can also cause a stroke.  Making lifestyle changes like losing a little weight or eating less salt will help.  Checking your blood pressure at home and at different times of the day can help to control blood pressure.  If the doctor prescribes medicine remember to take it the way the doctor ordered.  Call the office if you cannot afford the medicine or if there are questions about it.           Consent to CCM Services: Keith [name] was given information about Chronic Care Management services including:  CCM service includes personalized support from designated clinical staff supervised by his physician, including individualized plan of care and coordination with other care providers 24/7 contact phone numbers for assistance for urgent and routine care needs. Service will only be billed when office clinical staff spend 20 minutes or more in a month to coordinate care. Only one practitioner may furnish and bill the service in a calendar month. The patient may stop CCM services at any time (effective at the end of the month) by phone call to the office staff. The patient will be responsible for cost sharing (co-pay) of up to 20% of the service fee (after annual deductible is met).  Patient agreed to services and verbal consent obtained.   The  patient verbalized understanding of instructions, educational materials, and care plan provided today and declined offer to receive copy of patient instructions, educational materials, and care plan.   Telephone follow up appointment with care management team member scheduled for: 3 months The patient has been provided with contact information for the care management team and has been advised to call with any health related questions or concerns.   Keith [name], PharmD Clinical Pharmacist, [place]   CLINICAL CARE PLAN: Patient Care Plan: CCM Care Plan     Problem Identified: HTN, HLD, Vitamin D deficiency, Neuropathy   Priority: High  Onset Date: 04/28/2021     Long-Range Goal: Disease Management   Start Date: 04/28/2021  Expected End Date: 10/29/2021  This Visit's Progress: On track  Priority: High  Note:   Current Barriers:  Unable to independently monitor therapeutic efficacy Unable to maintain control of Blood pressure  Pharmacist Clinical Goal(s):  Patient will verbalize ability to afford treatment regimen achieve adherence to monitoring guidelines and medication adherence to achieve therapeutic efficacy achieve control of BP and vitamin D levels as evidenced by bp logs and next  vitamin D level maintain control of  LDL as evidenced by next lipid panel  through collaboration with PharmD and provider.   Interventions: 1:1 collaboration with Janith Lima, MD regarding development and update of comprehensive plan of care as evidenced by provider attestation and co-signature Inter-disciplinary care team collaboration (see longitudinal plan of care) Comprehensive medication review performed; medication list updated in electronic medical record  Hypertension (BP goal <140/90) - Unknown at this time - has not been checked since starting indapamide -Current treatment: Indapamide 1.27m - 1 tablet daily  -Medications previously tried: verapamil  -Current home  readings: n/a - has not been checking,  reports that he has a BP cuff but it is not working properly at this time  -Current dietary habits: reports to trying to watch sodium intake  -Current exercise habits: reports to being active caring for his house and doing yardwork -Denies hypotensive/hypertensive symptoms -Educated on BP goals and benefits of medications for prevention of heart attack, stroke and kidney damage; Daily salt intake goal < 2300 mg; Exercise goal of 150 minutes per week; Importance of home blood pressure monitoring; Proper BP monitoring technique; Symptoms of hypotension and importance of maintaining adequate hydration; -Counseled to monitor BP at home daily, document, and provide log at future appointments -Counseled on diet and exercise extensively Recommended to continue current medication  Hyperlipidemia: (LDL goal < 100) -Controlled Lab Results  Component Value Date   LDLCALC 37 04/06/2021  -Current treatment: Rosuvastatin 528m- 1 tablet daily  -Medications previously tried: n/a  -Current dietary patterns: reports to limited intake of foods high in cholesterol -Current exercise habits: reports to being active caring for his house and doing yardwork -Educated on Cholesterol goals;  Benefits of statin for ASCVD risk reduction; Importance of limiting foods high in cholesterol; Exercise goal of 150 minutes per week; -Counseled on diet and exercise extensively Recommended to continue current medication  Vitamin D Deficiency (Goal: Maintenance of appropriate vitamin d levels) -Not ideally controlled - level has not been rechecked since starting current vitamin D supplementation  Last vitamin D Lab Results  Component Value Date   VD25OH 14.23 (L) 04/06/2021  -Current treatment  Vitamin D3 50,000 units - 1 capsule once weekly  -Medications previously tried: n/a  -Recommended to continue current medication  Neuropathy (Goal: Pain  control) -Controlled -Current treatment Gabapentin 30065m 1 capsule twice daily  -Medications previously tried: naproxen, etodolac  -Recommended to continue current medication Educated on that gabapentin could be contributing to reported edema in legs, also discussed possible increase in dosage but as patient reports to fatigue from medication, concerned that increased dosage would cause excessive fatigue and increase swelling    Patient Goals/Self-Care Activities Patient will:  - take medications as prescribed check blood pressure daily, document, and provide at future appointments target a minimum of 150 minutes of moderate intensity exercise weekly engage in dietary modifications by reducing sodium and foods high in cholesterol   Follow Up Plan: Telephone follow up appointment with care management team member scheduled for: The patient has been provided with contact information for the care management team and has been advised to call with any health related questions or concerns.

## 2021-04-28 NOTE — Progress Notes (Signed)
Chronic Care Management Pharmacy Note  04/28/2021 Name:  Keith [name] MRN:  [id number] DOB:  [birth year]  Summary:  - Patient reports that he has been adherent with newly prescribed medications at last PCP visit, notes that swelling in legs has improved since starting indapamide, has not completely resolved, denies any issues with pain from the swelling  - Reports that he is unsure of improvement in BP control since starting indapamide as his blood pressure cuff is not working at this time  -Notes that neuropathy has improved with gabapentin, still can have mild tingling in hands and feet but overall well controlled  Recommendations/Changes made from today's visit: - Recommending for patient to purchase new blood pressure cuff to be able to check blood pressure to measure progress in blood pressure control, patient to reach out should by average >140/90 -Patient continue current medications, advised for patient to reduce sodium intake to less than 2342m in a day to help with BP control  Subjective: Keith [name] an 70y.o. year old male who is a primary patient of Keith Lima MD.  The CCM team was consulted for assistance with disease management and care coordination needs.    Engaged with patient by telephone for initial visit in response to provider referral for pharmacy case management and/or care coordination services.   Consent to Services:  The patient was given the following information about Chronic Care Management services today, agreed to services, and gave verbal consent: 1. CCM service includes personalized support from designated clinical staff supervised by the primary care provider, including individualized plan of care and coordination with other care providers 2. 24/7 contact phone numbers for assistance for urgent and routine care needs. 3. Service will only be billed when office clinical staff spend 20 minutes or more in a month to coordinate care. 4. Only  one practitioner may furnish and bill the service in a calendar month. 5.The patient may stop CCM services at any time (effective at the end of the month) by phone call to the office staff. 6. The patient will be responsible for cost sharing (co-pay) of up to 20% of the service fee (after annual deductible is met). Patient agreed to services and consent obtained.  Patient Care Team: Keith Lima MD as PCP - General (Internal Medicine)  Recent office visits:  04/06/21 TScarlette CalicoMD(PCP)- Patient was seen for Mucopurulent Chronic Bronchitis and his annual exam. Labs and xrays were ordered. Referrals for Gastro and Lung cancer screenings were placed. Patient started Cholecalciferol 50.000 units weekly, Indapamide 1.25 mg daily, and Rosuvastatin 5 MG daily. Etodolac, Naproxen, and verapamil were discontinued. Pneumonia vaccine was given   Recent consult visits:  None noted   Hospital visits:  None in previous 6 months  Objective:  Lab Results  Component Value Date   CREATININE 0.96 04/06/2021   BUN 12 04/06/2021   GFR 80.39 04/06/2021   NA 141 04/06/2021   K 4.4 04/06/2021   CALCIUM 9.6 04/06/2021   CO2 29 04/06/2021   GLUCOSE 79 04/06/2021    Lab Results  Component Value Date/Time   GFR 80.39 04/06/2021 02:42 PM    Last diabetic Eye exam:  No results found for: HMDIABEYEEXA  Last diabetic Foot exam:  No results found for: HMDIABFOOTEX   Lab Results  Component Value Date   CHOL 112 04/06/2021   HDL 54.00 04/06/2021   LDLCALC 37 04/06/2021   TRIG 105.0 04/06/2021   CHOLHDL 2 04/06/2021    Hepatic Function  Latest Ref Rng & Units 04/06/2021  Total Protein 6.0 - 8.3 g/dL 7.2  Albumin 3.5 - 5.2 g/dL 4.3  AST 0 - 37 U/L 20  ALT 0 - 53 U/L 9  Alk Phosphatase 39 - 117 U/L 81  Total Bilirubin 0.2 - 1.2 mg/dL 0.5  Bilirubin, Direct 0.0 - 0.3 mg/dL 0.1    Lab Results  Component Value Date/Time   TSH 2.57 04/06/2021 02:42 PM    CBC Latest Ref Rng & Units 04/06/2021   WBC 4.0 - 10.5 K/uL 3.1(L)  Hemoglobin 13.0 - 17.0 g/dL 13.0  Hematocrit 39.0 - 52.0 % 39.9  Platelets 150.0 - 400.0 K/uL 232.0    Lab Results  Component Value Date/Time   VD25OH 14.23 (L) 04/06/2021 02:42 PM    Clinical ASCVD: No  The ASCVD Risk score Keith [name]) failed to calculate for the following reasons:   The valid total cholesterol range is 130 to 320 mg/dL    Depression screen [facility] 2/9 04/06/2021  Decreased Interest 0  Down, Depressed, Hopeless 0  PHQ - 2 Score 0     Social History   Tobacco Use  Smoking Status Every Day   Packs/day: 0.50   Years: 40.00   Pack years: 20.00   Types: Cigarettes   Start date: 02/15/1969  Smokeless Tobacco Never   BP Readings from Last 3 Encounters:  04/06/21 (!) 152/90   Pulse Readings from Last 3 Encounters:  04/06/21 (!) 51   Wt Readings from Last 3 Encounters:  04/06/21 119 lb (54 kg)   BMI Readings from Last 3 Encounters:  04/06/21 19.80 kg/m    Assessment/Interventions: Review of patient past medical history, allergies, medications, health status, including review of consultants reports, laboratory and other test data, was performed as part of comprehensive evaluation and provision of chronic care management services.   SDOH:  (Social Determinants of Health) assessments and interventions performed: Yes  SDOH Screenings   Alcohol Screen: Not on file  Depression (PHQ2-9): Low Risk    PHQ-2 Score: 0  Financial Resource Strain: Low Risk    Difficulty of Paying Living Expenses: Not very hard  Food Insecurity: Not on file  Housing: Not on file  Physical Activity: Not on file  Social Connections: Not on file  Stress: Not on file  Tobacco Use: High Risk   Smoking Tobacco Use: Every Day   Smokeless Tobacco Use: Never  Transportation Needs: Not on file    [place]  No Known Allergies  Medications Reviewed Today     Reviewed by Keith [name], [facility] (Pharmacist) on 04/28/21 at [place] [facility]  Status: <None>   Medication Order Taking? Sig Documenting Provider Last Dose Status Informant  Cholecalciferol 1.25 MG (50000 UT) capsule [id number] Yes Take 1 capsule (50,000 Units total) by mouth once a week. Keith Lima, MD Taking Active   gabapentin (NEURONTIN) 300 MG capsule [id number] Yes Take 1 capsule (300 mg total) by mouth 2 (two) times daily. Keith Lima, MD Taking Active   indapamide (LOZOL) 1.25 MG tablet [id number] Yes Take 1 tablet (1.25 mg total) by mouth daily. Keith Lima, MD Taking Active   rosuvastatin (CRESTOR) 5 MG tablet [id number] Yes Take 1 tablet (5 mg total) by mouth daily. Keith Lima, MD Taking Active             Patient Active Problem List   Diagnosis Date Noted   Encounter for general adult medical examination with  abnormal findings 04/08/2021   PSA elevation 04/07/2021   Vitamin D deficiency disease 04/07/2021   Mucopurulent chronic bronchitis ([place]) 04/06/2021   Primary hypertension 04/06/2021   Tobacco abuse 04/06/2021   Abnormal physical evaluation 04/06/2021   Screen for colon cancer 04/06/2021   Rash of back 04/06/2021   Bradycardia, sinus 04/06/2021   Benign prostatic hyperplasia with urinary frequency 04/06/2021   Cough with sputum 04/06/2021   Need for hepatitis C screening test 04/06/2021   Dyslipidemia, goal LDL below 100 04/06/2021   Need for vaccination 04/06/2021   Neuropathy 04/06/2021   Claudication of both lower extremities ([place]) 04/06/2021   ALCOHOL ABUSE 08/25/2009   HEMORRHOIDS 08/25/2009   COPD 08/25/2009   GERD 08/25/2009   DYSPHAGIA 08/25/2009   ANEMIA 08/24/2009   WEIGHT LOSS 08/24/2009    Immunization History  Administered Date(s) Administered   PFIZER(Purple Top)SARS-COV-2 Vaccination 10/23/2019, 11/13/2019, 06/27/2020   PNEUMOCOCCAL CONJUGATE-20 04/06/2021    Conditions to be addressed/monitored:  Hypertension, Hyperlipidemia, vitamin D deficiency and neuropathy  Care Plan : CCM Care Plan  Updates  made by Keith [name], [place] since 04/28/2021 12:00 AM     Problem: HTN, HLD, Vitamin D deficiency, Neuropathy   Priority: High  Onset Date: 04/28/2021     Long-Range Goal: Disease Management   Start Date: 04/28/2021  Expected End Date: 10/29/2021  This Visit's Progress: On track  Priority: High  Note:   Current Barriers:  Unable to independently monitor therapeutic efficacy Unable to maintain control of Blood pressure  Pharmacist Clinical Goal(s):  Patient will verbalize ability to afford treatment regimen achieve adherence to monitoring guidelines and medication adherence to achieve therapeutic efficacy achieve control of BP and vitamin D levels as evidenced by bp logs and next  vitamin D level maintain control of LDL as evidenced by next lipid panel  through collaboration with PharmD and provider.   Interventions: 1:1 collaboration with Keith Lima, MD regarding development and update of comprehensive plan of care as evidenced by provider attestation and co-signature Inter-disciplinary care team collaboration (see longitudinal plan of care) Comprehensive medication review performed; medication list updated in electronic medical record  Hypertension (BP goal <140/90) - Unknown at this time - has not been checked since starting indapamide -Current treatment: Indapamide 1.28m - 1 tablet daily  -Medications previously tried: verapamil  -Current home readings: n/a - has not been checking,  reports that he has a BP cuff but it is not working properly at this time  -Current dietary habits: reports to trying to watch sodium intake  -Current exercise habits: reports to being active caring for his house and doing yardwork -Denies hypotensive/hypertensive symptoms -Educated on BP goals and benefits of medications for prevention of heart attack, stroke and kidney damage; Daily salt intake goal < 2300 mg; Exercise goal of 150 minutes per week; Importance of home blood pressure  monitoring; Proper BP monitoring technique; Symptoms of hypotension and importance of maintaining adequate hydration; -Counseled to monitor BP at home daily, document, and provide log at future appointments -Counseled on diet and exercise extensively Recommended to continue current medication  Hyperlipidemia: (LDL goal < 100) -Controlled Lab Results  Component Value Date   LDLCALC 37 04/06/2021  -Current treatment: Rosuvastatin 544m- 1 tablet daily  -Medications previously tried: n/a  -Current dietary patterns: reports to limited intake of foods high in cholesterol -Current exercise habits: reports to being active caring for his house and doing yardwork -Educated on Cholesterol goals;  Benefits of statin for ASCVD risk  reduction; Importance of limiting foods high in cholesterol; Exercise goal of 150 minutes per week; -Counseled on diet and exercise extensively Recommended to continue current medication  Vitamin D Deficiency (Goal: Maintenance of appropriate vitamin d levels) -Not ideally controlled - level has not been rechecked since starting current vitamin D supplementation  Last vitamin D Lab Results  Component Value Date   VD25OH 14.23 (L) 04/06/2021  -Current treatment  Vitamin D3 50,000 units - 1 capsule once weekly  -Medications previously tried: n/a  -Recommended to continue current medication  Neuropathy (Goal: Pain control) -Controlled -Current treatment Gabapentin 356m - 1 capsule twice daily  -Medications previously tried: naproxen, etodolac  -Recommended to continue current medication Educated on that gabapentin could be contributing to reported edema in legs, also discussed possible increase in dosage but as patient reports to fatigue from medication, concerned that increased dosage would cause excessive fatigue and increase swelling    Patient Goals/Self-Care Activities Patient will:  - take medications as prescribed check blood pressure daily,  document, and provide at future appointments target a minimum of 150 minutes of moderate intensity exercise weekly engage in dietary modifications by reducing sodium and foods high in cholesterol   Follow Up Plan: Telephone follow up appointment with care management team member scheduled for: The patient has been provided with contact information for the care management team and has been advised to call with any health related questions or concerns.        Medication Assistance: None required.  Patient affirms current coverage meets needs.  Patient's preferred pharmacy is:  [facility] [place] [id number]: [phone]: [phone] [id number] [address] [address]Phone: [phone]: [phone]  Uses pill box? No - able to manage without  Pt endorses 100% compliance  Care Plan and Follow Up Patient Decision:  Patient agrees to Care Plan and Follow-up.  Plan: Telephone follow up appointment with care management team member scheduled for:  1 month and The patient has been provided with contact information for the care management team and has been advised to call with any health related questions or concerns.   DTomasa [name] PharmD Clinical Pharmacist, [place]

## 2021-05-05 ENCOUNTER — Telehealth: Payer: Self-pay | Admitting: Internal Medicine

## 2021-05-11 ENCOUNTER — Other Ambulatory Visit: Payer: Self-pay | Admitting: Internal Medicine

## 2021-05-11 DIAGNOSIS — I10 Essential (primary) hypertension: Secondary | ICD-10-CM

## 2021-05-11 DIAGNOSIS — G629 Polyneuropathy, unspecified: Secondary | ICD-10-CM

## 2021-05-11 DIAGNOSIS — E559 Vitamin D deficiency, unspecified: Secondary | ICD-10-CM

## 2021-05-11 MED ORDER — GABAPENTIN 300 MG PO CAPS: 300.0000 mg | ORAL_CAPSULE | Freq: Two times a day (BID) | ORAL | 1 refills | Status: DC

## 2021-05-11 MED ORDER — CHOLECALCIFEROL 1.25 MG (50000 UT) PO CAPS: 50000.0000 [IU] | ORAL_CAPSULE | ORAL | 0 refills | Status: DC

## 2021-05-11 MED ORDER — INDAPAMIDE 1.25 MG PO TABS: 1.2500 mg | ORAL_TABLET | Freq: Every day | ORAL | 0 refills | Status: DC

## 2021-05-17 ENCOUNTER — Other Ambulatory Visit: Payer: Self-pay | Admitting: *Deleted

## 2021-05-17 DIAGNOSIS — F17210 Nicotine dependence, cigarettes, uncomplicated: Secondary | ICD-10-CM

## 2021-05-17 DIAGNOSIS — Z87891 Personal history of nicotine dependence: Secondary | ICD-10-CM

## 2021-05-18 DIAGNOSIS — L700 Acne vulgaris: Secondary | ICD-10-CM | POA: Diagnosis not present

## 2021-05-31 ENCOUNTER — Other Ambulatory Visit: Payer: Self-pay

## 2021-05-31 ENCOUNTER — Ambulatory Visit (AMBULATORY_SURGERY_CENTER): Payer: Medicare HMO

## 2021-05-31 VITALS — Ht 65.5 in | Wt 119.5 lb

## 2021-05-31 DIAGNOSIS — Z1211 Encounter for screening for malignant neoplasm of colon: Secondary | ICD-10-CM

## 2021-05-31 MED ORDER — GOLYTELY 236 G PO SOLR: 4000.0000 mL | ORAL | 0 refills | Status: DC

## 2021-05-31 NOTE — Progress Notes (Signed)
Pre visit completed via phone call; patient verified name, DOB, and address;  No egg or soy allergy known to patient  No issues known to pt with past sedation with any surgeries or procedures Patient denies ever being told they had issues or difficulty with intubation  No FH of Malignant Hyperthermia Pt is not on diet pills Pt is not on  home 02  Pt is not on blood thinners  Pt denies issues with constipation  No A fib or A flutter  COVID 19 guidelines implemented in PV today with Pt and RN  Pt is fully vaccinated for Covid x 2 + booster; NO PA's for preps discussed with pt in PV today  Discussed with pt there will be an out-of-pocket cost for prep and that varies from $0 to 70 +  dollars  Due to the COVID-19 pandemic we are asking patients to follow certain guidelines.  Pt aware of COVID protocols and [facility] guidelines

## 2021-06-01 ENCOUNTER — Encounter: Payer: Self-pay | Admitting: Pulmonary Disease

## 2021-06-01 ENCOUNTER — Ambulatory Visit (INDEPENDENT_AMBULATORY_CARE_PROVIDER_SITE_OTHER): Payer: Medicare HMO | Admitting: Pulmonary Disease

## 2021-06-01 ENCOUNTER — Encounter: Payer: Self-pay | Admitting: Gastroenterology

## 2021-06-01 DIAGNOSIS — F17210 Nicotine dependence, cigarettes, uncomplicated: Secondary | ICD-10-CM

## 2021-06-01 DIAGNOSIS — Z720 Tobacco use: Secondary | ICD-10-CM

## 2021-06-01 DIAGNOSIS — Z122 Encounter for screening for malignant neoplasm of respiratory organs: Secondary | ICD-10-CM

## 2021-06-01 NOTE — Progress Notes (Signed)
Virtual Visit via Telephone Note  I connected with Keith [name] on 06/01/21 at 11:30 AM EDT by telephone and verified that I am speaking with the correct person using two identifiers.  Location: Patient: At home Provider: [place]    I discussed the limitations, risks, security and privacy concerns of performing an evaluation and management service by telephone and the availability of in person appointments. I also discussed with the patient that there may be a patient responsible charge related to this service. The patient expressed understanding and agreed to proceed.  Shared Decision Making Visit Lung Cancer Screening Program [phone])   Eligibility: Age 70 y.o. Pack Years Smoking History Calculation 23 pack year history  (# packs/per year x # years smoked) Recent History of coughing up blood  no Unexplained weight loss? no ( >Than 15 pounds within the last 6 months ) Prior History Lung / other cancer no (Diagnosis within the last 5 years already requiring surveillance chest CT Scans). Smoking Status Current Smoker   0.3ppd - 6-7 cigarettes   Visit Components: Discussion included one or more decision making aids. yes Discussion included risk/benefits of screening. yes Discussion included potential follow up diagnostic testing for abnormal scans. yes Discussion included meaning and risk of over diagnosis. yes Discussion included meaning and risk of False Positives. yes Discussion included meaning of total radiation exposure. yes  Counseling Included: Importance of adherence to annual lung cancer LDCT screening. yes Impact of comorbidities on ability to participate in the program. yes Ability and willingness to under diagnostic treatment. yes  Smoking Cessation Counseling: Current Smokers:  Discussed importance of smoking cessation. yes Information about tobacco cessation classes and interventions provided to patient. yes Patient provided  with "ticket" for LDCT Scan. yes Symptomatic Patient. yes  Counseling(Intensive: > 10 minutes counseling) [id number] Diagnosis Code: Tobacco Use Z72.0 Patient provided with "ticket" for LDCT Scan. yes Written Order for Lung Cancer Screening with LDCT placed in Epic. Yes (CT Chest Lung Cancer Screening Low Dose W/O CM) [id number] Z12.2-Screening of respiratory organs Z87.891-Personal history of nicotine dependence   Lauraine Rinne, NP

## 2021-06-01 NOTE — Patient Instructions (Signed)
Thank you for participating in the [place]. It was our pleasure to meet you today. We will call you with the results of your scan within the next few days. Your scan will be assigned a Lung RADS category score by the physicians reading the scans.  This Lung RADS score determines follow up scanning.  See below for description of categories, and follow up screening recommendations. We will be in touch to schedule your follow up screening annually or based on recommendations of our providers. We will fax a copy of your scan results to your Primary Care Physician, or the physician who referred you to the program, to ensure they have the results. Please call the office if you have any questions or concerns regarding your scanning experience or results.  Our office number is [phone]. Please speak with Doroteo Glassman, RN. She is our Lung Cancer Lobbyist. If she is unavailable when you call, please have the office staff send her a message. She will return your call at her earliest convenience. Remember, if your scan is normal, we will scan you annually as long as you continue to meet the criteria for the program. (Age 47-77, Current smoker or smoker who has quit within the last 15 years). If you are a smoker, remember, quitting is the single most powerful action that you can take to decrease your risk of lung cancer and other pulmonary, breathing related problems. We know quitting is hard, and we are here to help.  Please let us know if there is anything we can do to help you meet your goal of quitting. 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 We are proud of you! Remain smoke free! Remember you can refer friends or family members through the number above.  We will screen them to make sure they meet criteria for the program. Thank you for helping [place] take better care of you by participating in Lung Screening.  Lung RADS Categories:  Lung RADS 1: no nodules  or definitely non-concerning nodules.  Recommendation is for a repeat annual scan in 12 months.  Lung RADS 2:  nodules that are non-concerning in appearance and behavior with a very low likelihood of becoming an active cancer. Recommendation is for a repeat annual scan in 12 months.  Lung RADS 3: nodules that are probably non-concerning , includes nodules with a low likelihood of becoming an active cancer.  Recommendation is for a 74-monthrepeat screening scan. Often noted after an upper respiratory illness. We will be in touch to make sure you have no questions, and to schedule your 683-monthcan.  Lung RADS 4 A: nodules with concerning findings, recommendation is most often for a follow up scan in 3 months or additional testing based on our provider's assessment of the scan. We will be in touch to make sure you have no questions and to schedule the recommended 3 month follow up scan.  Lung RADS 4 B:  indicates findings that are concerning. We will be in touch with you to schedule additional diagnostic testing based on our provider's  assessment of the scan.    Steps to Quit Smoking Smoking tobacco is the leading cause of preventable death. It can affect almost every organ in the body. Smoking puts you and people around you at risk for many serious, long-lasting (chronic) diseases. Quitting smoking can be hard, but it is one of the best things that you can do for your health. It is never too late  to quit. How do I get ready to quit? When you decide to quit smoking, make a plan to help you succeed. Before you quit: Pick a date to quit. Set a date within the next 2 weeks to give you time to prepare. Write down the reasons why you are quitting. Keep this list in places where you will see it often. Tell your family, friends, and co-workers that you are quitting. Their support is important. Talk with your doctor about the choices that may help you quit. Find out if your health insurance will pay for  these treatments. Know the people, places, things, and activities that make you want to smoke (triggers). Avoid them. What first steps can I take to quit smoking? Throw away all cigarettes at home, at work, and in your car. Throw away the things that you use when you smoke, such as ashtrays and lighters. Clean your car. Make sure to empty the ashtray. Clean your home, including curtains and carpets. What can I do to help me quit smoking? Talk with your doctor about taking medicines and seeing a counselor at the same time. You are more likely to succeed when you do both. If you are pregnant or breastfeeding, talk with your doctor about counseling or other ways to quit smoking. Do not take medicine to help you quit smoking unless your doctor tells you to do so. To quit smoking: Quit right away Quit smoking totally, instead of slowly cutting back on how much you smoke over a period of time. Go to counseling. You are more likely to quit if you go to counseling sessions regularly. Take medicine You may take medicines to help you quit. Some medicines need a prescription, and some you can buy over-the-counter. Some medicines may contain a drug called nicotine to replace the nicotine in cigarettes. Medicines may: Help you to stop having the desire to smoke (cravings). Help to stop the problems that come when you stop smoking (withdrawal symptoms). Your doctor may ask you to use: Nicotine patches, gum, or lozenges. Nicotine inhalers or sprays. Non-nicotine medicine that is taken by mouth. Find resources Find resources and other ways to help you quit smoking and remain smoke-free after you quit. These resources are most helpful when you use them often. They include: Online chats with a Social worker. Phone quitlines. Printed Furniture conservator/restorer. Support groups or group counseling. Text messaging programs. Mobile phone apps. Use apps on your mobile phone or tablet that can help you stick to your quit  plan. There are many free apps for mobile phones and tablets as well as websites. Examples include Quit Guide from the [employer] and smokefree.gov  What things can I do to make it easier to quit?  Talk to your family and friends. Ask them to support and encourage you. Call a phone quitline (1-800-QUIT-NOW), reach out to support groups, or work with a Social worker. Ask people who smoke to not smoke around you. Avoid places that make you want to smoke, such as: Bars. Parties. Smoke-break areas at work. Spend time with people who do not smoke. Lower the stress in your life. Stress can make you want to smoke. Try these things to help your stress: Getting regular exercise. Doing deep-breathing exercises. Doing yoga. Meditating. Doing a body scan. To do this, close your eyes, focus on one area of your body at a time from head to toe. Notice which parts of your body are tense. Try to relax the muscles in those areas. How will I feel  when I quit smoking? Day 1 to 3 weeks Within the first 24 hours, you may start to have some problems that come from quitting tobacco. These problems are very bad 2-3 days after you quit, but they do not often last for more than 2-3 weeks. You may get these symptoms: Mood swings. Feeling restless, nervous, angry, or annoyed. Trouble concentrating. Dizziness. Strong desire for high-sugar foods and nicotine. Weight gain. Trouble pooping (constipation). Feeling like you may vomit (nausea). Coughing or a sore throat. Changes in how the medicines that you take for other issues work in your body. Depression. Trouble sleeping (insomnia). Week 3 and afterward After the first 2-3 weeks of quitting, you may start to notice more positive results, such as: Better sense of smell and taste. Less coughing and sore throat. Slower heart rate. Lower blood pressure. Clearer skin. Better breathing. Fewer sick days. Quitting smoking can be hard. Do not give up if you fail the first  time. Some people need to try a few times before they succeed. Do your best to stick to your quit plan, and talk with your doctor if you have any questions or concerns. Summary Smoking tobacco is the leading cause of preventable death. Quitting smoking can be hard, but it is one of the best things that you can do for your health. When you decide to quit smoking, make a plan to help you succeed. Quit smoking right away, not slowly over a period of time. When you start quitting, seek help from your doctor, family, or friends. This information is not intended to replace advice given to you by your health care provider. Make sure you discuss any questions you have with your health care provider. Document Revised: 05/29/2019 Document Reviewed: 11/22/2018 Elsevier Patient Education  [place].

## 2021-06-02 ENCOUNTER — Ambulatory Visit (INDEPENDENT_AMBULATORY_CARE_PROVIDER_SITE_OTHER)
Admission: RE | Admit: 2021-06-02 | Discharge: 2021-06-02 | Disposition: A | Discharge status: Home / Self Care | Payer: Medicare HMO | Source: Ambulatory Visit | Attending: Cardiovascular Disease | Admitting: Cardiovascular Disease

## 2021-06-02 ENCOUNTER — Other Ambulatory Visit: Payer: Self-pay

## 2021-06-02 DIAGNOSIS — F17210 Nicotine dependence, cigarettes, uncomplicated: Secondary | ICD-10-CM | POA: Diagnosis not present

## 2021-06-02 DIAGNOSIS — Z87891 Personal history of nicotine dependence: Secondary | ICD-10-CM

## 2021-06-02 IMAGING — CT CT CHEST LUNG CANCER SCREENING LOW DOSE W/O CM
2 of 5 series · 15 of 40 positions shown, 18 images · non-contrast
Comparison: None.

CLINICAL DATA: 70-year-old male with 23 pack-year history of
smoking. Lung cancer screening.

EXAM:
CT CHEST WITHOUT CONTRAST LOW-DOSE FOR LUNG CANCER SCREENING
TECHNIQUE: Multidetector CT imaging of the chest was performed following the
standard protocol without IV contrast.

[Series 3: lung thins 1.0 · axial · 0.60mm/px · z∈[-184,+112]mm · 12 of 328 slices shown, 15 images]
[im 16/328  mediastinal]
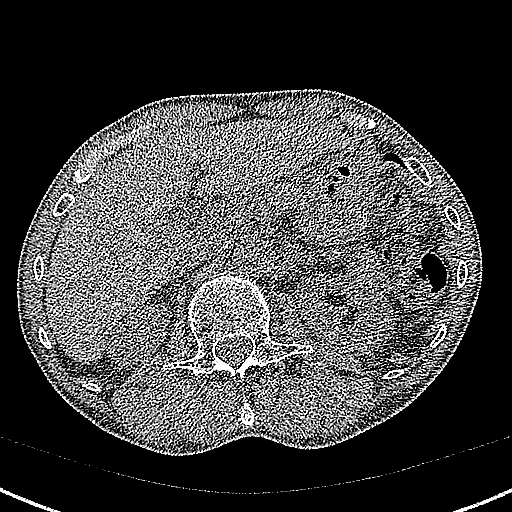
[im 16/328  lung]
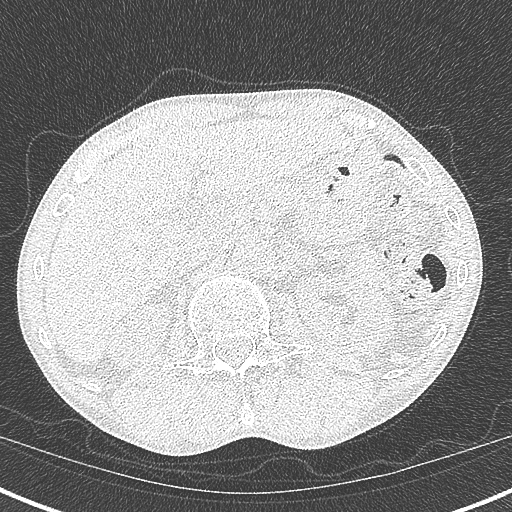
[im 47/328  lung]
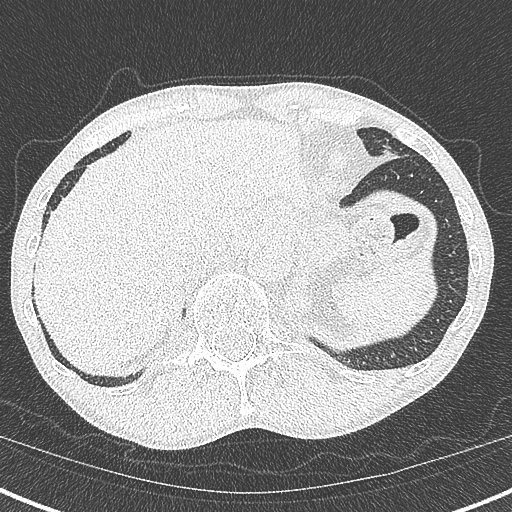
[im 78/328  lung]
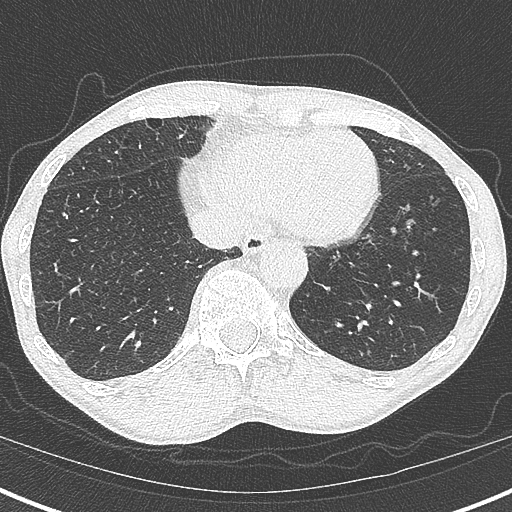
[im 94/328  lung]
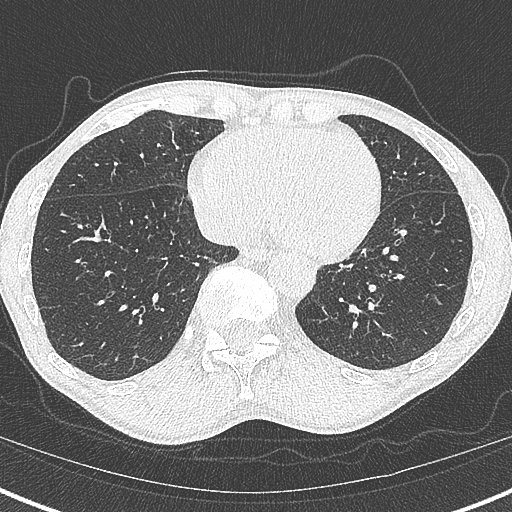
[im 125/328  mediastinal]
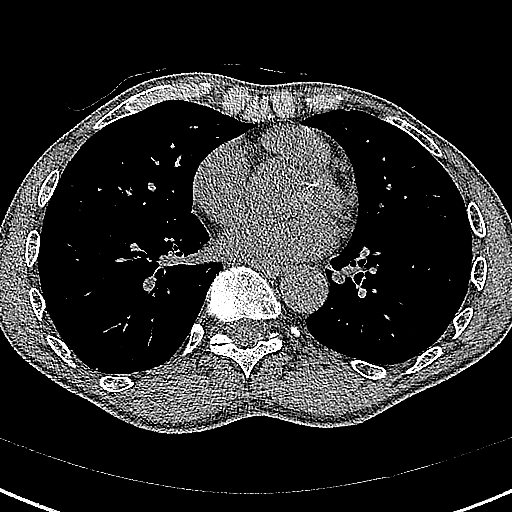
[im 125/328  lung]
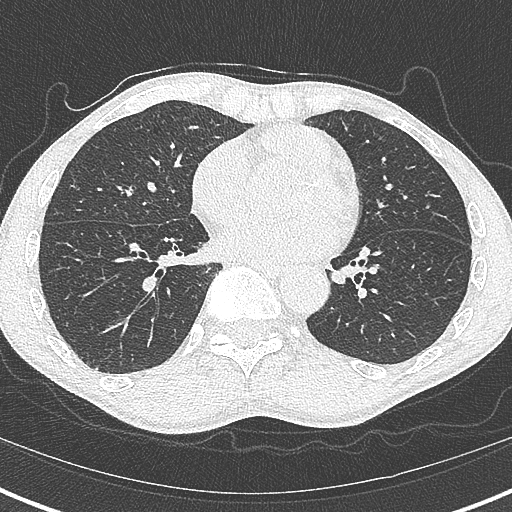
[im 156/328  lung]
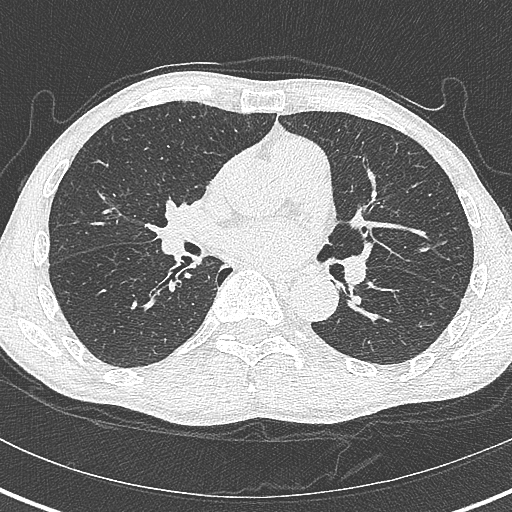
[im 172/328  lung]
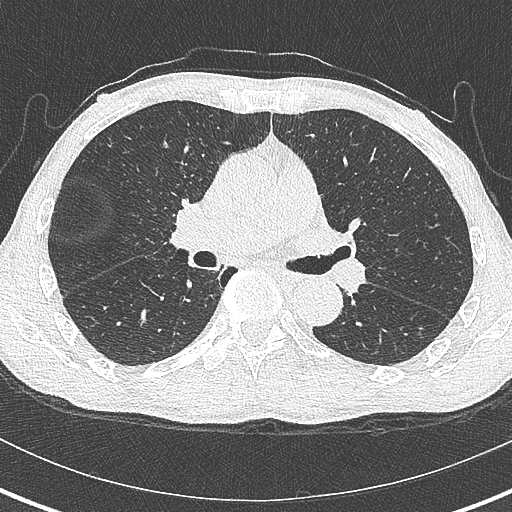
[im 203/328  lung]
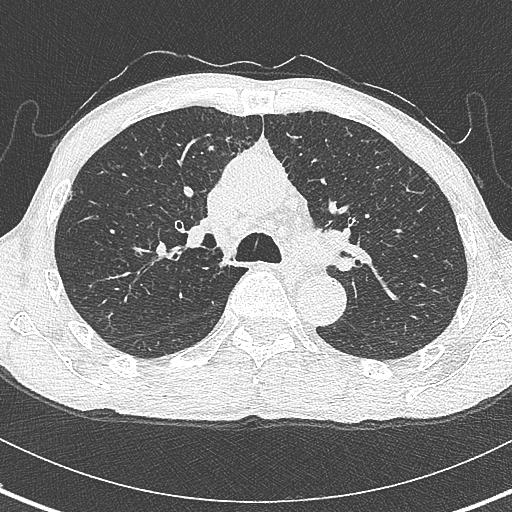
[im 234/328  mediastinal]
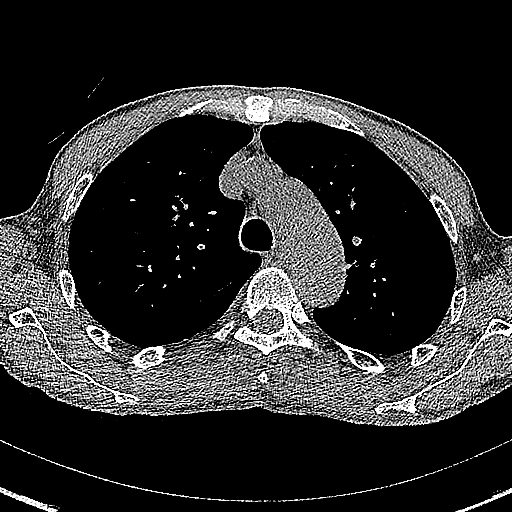
[im 234/328  lung]
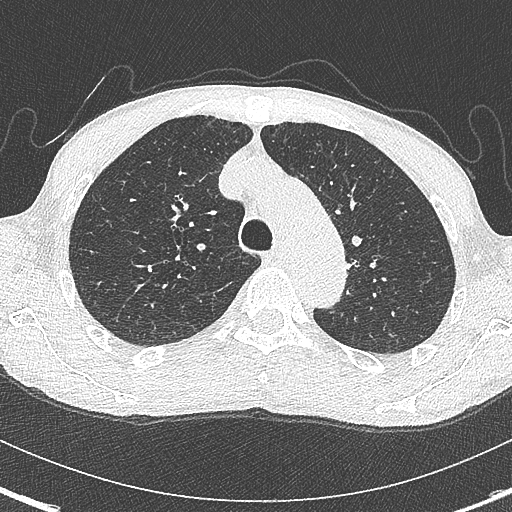
[im 250/328  lung]
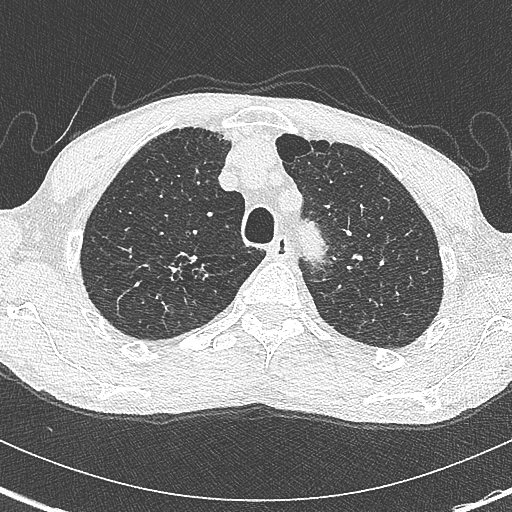
[im 281/328  lung]
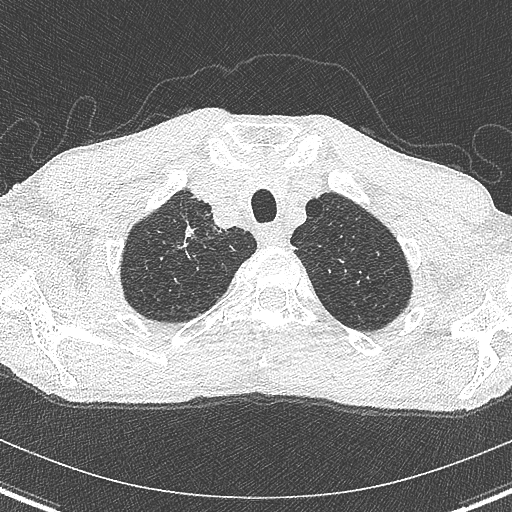
[im 312/328  lung]
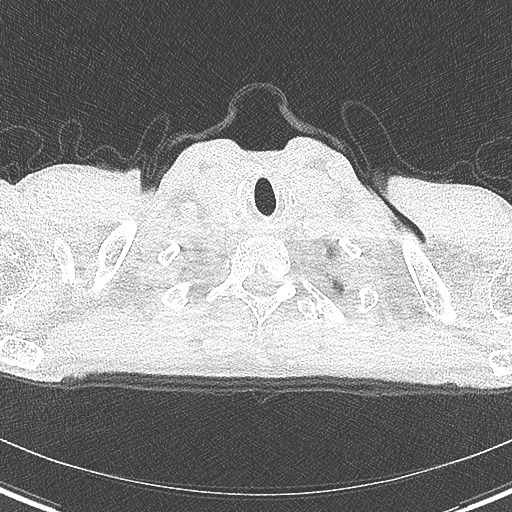

[Series 5: coronal · coronal · 0.62mm/px · 3 of 106 slices shown]
[im 22/106  lung]
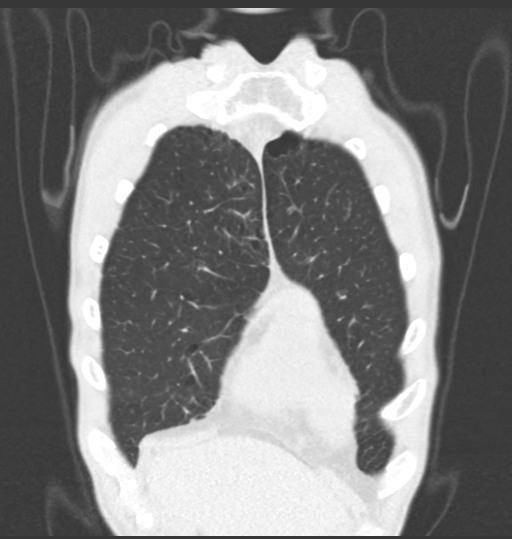
[im 43/106  lung]
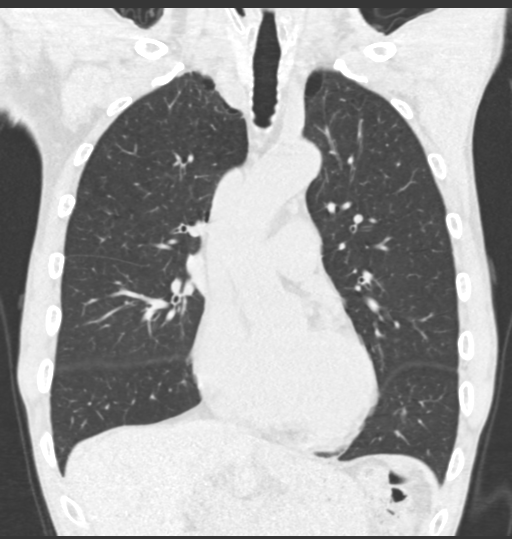
[im 64/106  lung]
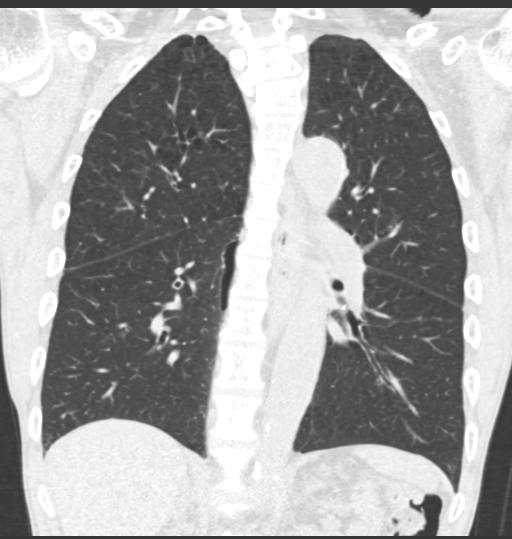

[15 of 40 positions shown; findings below may reference images not displayed]

FINDINGS: Cardiovascular: The heart size is normal. No substantial pericardial
effusion. Mild atherosclerotic calcification is noted in the wall of
the thoracic aorta.

Mediastinum/Nodes: No mediastinal lymphadenopathy. No evidence for
gross hilar lymphadenopathy although assessment is limited by the
lack of intravenous contrast on the current study. The esophagus has
normal imaging features. There is no axillary lymphadenopathy.

Lungs/Pleura: Scattered tiny calcified centrilobular emphsyema
noted. And noncalcified pulmonary nodules are identified. There is
an irregular nodular opacity in the right apex with volume derived
equivalent diameter of 9.2 mm. No focal airspace consolidation. No
pleural effusion.

Upper Abdomen: Unremarkable.

Musculoskeletal: No worrisome lytic or sclerotic osseous
abnormality.
IMPRESSION: 1. Lung-RADS 4A, suspicious. Follow up low-dose chest CT without
contrast in 3 months (please use the following order, "CT CHEST LCS
NODULE FOLLOW-UP W/O CM") is recommended. Alternatively, PET may be
considered when there is a solid component 8mm or larger.
2. 9.2 mm irregular nodular opacity in the right lung apex,
representing the nodule of concern. This is likely scar, but given
baseline study and size of this finding, close follow-up
recommended.
3.  Emphysema ([33]-[33]) and Aortic Atherosclerosis ([33]-170.0)

These results will be called to the ordering clinician or
representative by the Radiologist Assistant, and communication
documented in the PACS or [REDACTED].

## 2021-06-05 ENCOUNTER — Telehealth: Payer: Self-pay | Admitting: Acute Care

## 2021-06-05 ENCOUNTER — Telehealth: Payer: Self-pay

## 2021-06-05 NOTE — Telephone Encounter (Signed)
Received call report from Red from [facility] radiology on low dose CT.  Sarah, please advise. Thanks  IMPRESSION: 1. Lung-RADS 4A, suspicious. Follow up low-dose chest CT without contrast in 3 months (please use the following order, "CT CHEST LCS NODULE FOLLOW-UP W/O CM") is recommended. Alternatively, PET may be considered when there is a solid component 70m or larger. 2. 9.2 mm irregular nodular opacity in the right lung apex, representing the nodule of concern. This is likely scar, but given baseline study and size of this finding, close follow-up recommended. 3.  Emphysema (ICD10-J43.9) and Aortic Atherosclerosis (ICD10-170.0)   These results will be called to the ordering clinician or representative by the Radiologist Assistant, and communication documented in the PACS or [employer]

## 2021-06-05 NOTE — Telephone Encounter (Signed)
Please advise as the pt has stated he is having a reaction to a recent med rx'd by his dermatologist. DOXYCYCLINE '100mg'$  2x a day. Pt has been taking his med for the past 7 days and he has noticed his feet are swelling, dizziness if he hold his head down to long and some pain in his feet.   **Pt denies, N/V/D, SOB and double vision.  **Pt was advise to not take medication until he hears from Dr. Ronnald Ramp.  **Pt can be reached at [phone] or [phone].

## 2021-06-06 NOTE — Telephone Encounter (Signed)
I was able to speak with and advise him that Dr. Ronnald Ramp would for him to be seen due to the reaction. Pt is schedule to come see Dr. Ronnald Ramp on 06/07/2021 at 3pm.

## 2021-06-07 ENCOUNTER — Ambulatory Visit (INDEPENDENT_AMBULATORY_CARE_PROVIDER_SITE_OTHER): Payer: Medicare HMO | Admitting: Internal Medicine

## 2021-06-07 ENCOUNTER — Encounter: Payer: Self-pay | Admitting: Internal Medicine

## 2021-06-07 ENCOUNTER — Other Ambulatory Visit: Payer: Self-pay

## 2021-06-07 VITALS — BP 106/68 | HR 80 | Temp 98.0°F | Resp 16 | Ht 65.5 in | Wt 122.0 lb

## 2021-06-07 DIAGNOSIS — M25572 Pain in left ankle and joints of left foot: Secondary | ICD-10-CM

## 2021-06-07 DIAGNOSIS — I10 Essential (primary) hypertension: Secondary | ICD-10-CM

## 2021-06-07 DIAGNOSIS — Z23 Encounter for immunization: Secondary | ICD-10-CM | POA: Diagnosis not present

## 2021-06-07 DIAGNOSIS — E785 Hyperlipidemia, unspecified: Secondary | ICD-10-CM

## 2021-06-07 DIAGNOSIS — R972 Elevated prostate specific antigen [PSA]: Secondary | ICD-10-CM

## 2021-06-07 LAB — CBC WITH DIFFERENTIAL/PLATELET
Basophils Absolute: 0 10*3/uL (ref 0.0–0.1)
Basophils Relative: 0.7 % (ref 0.0–3.0)
Eosinophils Absolute: 0.2 10*3/uL (ref 0.0–0.7)
Eosinophils Relative: 6.8 % — ABNORMAL HIGH (ref 0.0–5.0)
HCT: 33.3 % — ABNORMAL LOW (ref 39.0–52.0)
Hemoglobin: 11 g/dL — ABNORMAL LOW (ref 13.0–17.0)
Lymphocytes Relative: 46.3 % — ABNORMAL HIGH (ref 12.0–46.0)
Lymphs Abs: 1.4 10*3/uL (ref 0.7–4.0)
MCHC: 33 g/dL (ref 30.0–36.0)
MCV: 91.2 fl (ref 78.0–100.0)
Monocytes Absolute: 0.3 10*3/uL (ref 0.1–1.0)
Monocytes Relative: 11.4 % (ref 3.0–12.0)
Neutro Abs: 1 10*3/uL — ABNORMAL LOW (ref 1.4–7.7)
Neutrophils Relative %: 34.8 % — ABNORMAL LOW (ref 43.0–77.0)
Platelets: 223 10*3/uL (ref 150.0–400.0)
RBC: 3.65 Mil/uL — ABNORMAL LOW (ref 4.22–5.81)
RDW: 12.1 % (ref 11.5–15.5)
WBC: 3 10*3/uL — ABNORMAL LOW (ref 4.0–10.5)

## 2021-06-07 LAB — BASIC METABOLIC PANEL
BUN: 17 mg/dL (ref 6–23)
CO2: 31 mEq/L (ref 19–32)
Calcium: 9.6 mg/dL (ref 8.4–10.5)
Chloride: 102 mEq/L (ref 96–112)
Creatinine, Ser: 1.09 mg/dL (ref 0.40–1.50)
GFR: 68.94 mL/min (ref >60.00)
Glucose, Bld: 87 mg/dL (ref 70–99)
Potassium: 4.1 mEq/L (ref 3.5–5.1)
Sodium: 139 mEq/L (ref 135–145)

## 2021-06-07 LAB — URIC ACID: Uric Acid, Serum: 5.4 mg/dL (ref 4.0–7.8)

## 2021-06-07 LAB — MAGNESIUM: Magnesium: 1.4 mg/dL — ABNORMAL LOW (ref 1.5–2.5)

## 2021-06-07 LAB — CK: Total CK: 202 U/L (ref 7–232)

## 2021-06-07 LAB — SEDIMENTATION RATE: Sed Rate: 15 mm/hr (ref 0–20)

## 2021-06-07 LAB — PSA: PSA: 4.13 ng/mL — ABNORMAL HIGH (ref 0.10–4.00)

## 2021-06-07 MED ORDER — MAGNESIUM OXIDE 400 MG PO CAPS: 1.0000 | ORAL_CAPSULE | Freq: Every day | ORAL | 0 refills | Status: DC

## 2021-06-07 NOTE — Progress Notes (Signed)
Subjective:  Patient ID: Keith [name], male    DOB: [birth year]  Age: 70 y.o. MRN: [id number]  CC: Hypertension and Hyperlipidemia  This visit occurred during the SARS-CoV-2 public health emergency.  Safety protocols were in place, including screening questions prior to the visit, additional usage of staff PPE, and extensive cleaning of exam room while observing appropriate contact time as indicated for disinfecting solutions.    HPI Keith [name] presents for f/up -  He complains that he has a chronic sensation that his muscles are jumping.  He recently had painless swelling in his left ankle but that has gotten better.  He denies chest pain, shortness of breath, dizziness, lightheadedness, or diaphoresis.  He has decided not to take gabapentin.  Outpatient Medications Prior to Visit  Medication Sig Dispense Refill   Cholecalciferol 1.25 MG (50000 UT) capsule Take 1 capsule (50,000 Units total) by mouth once a week. 12 capsule 0   polyethylene glycol (GOLYTELY) 236 g solution Take 4,000 mLs by mouth as directed. 4000 mL 0   rosuvastatin (CRESTOR) 5 MG tablet Take 1 tablet (5 mg total) by mouth daily. 90 tablet 1   gabapentin (NEURONTIN) 300 MG capsule Take 1 capsule (300 mg total) by mouth 2 (two) times daily. 180 capsule 1   indapamide (LOZOL) 1.25 MG tablet Take 1 tablet (1.25 mg total) by mouth daily. 90 tablet 0   No facility-administered medications prior to visit.    ROS Review of Systems  Constitutional:  Negative for diaphoresis and fatigue.  HENT: Negative.    Eyes: Negative.   Respiratory:  Negative for cough, chest tightness, shortness of breath and wheezing.   Cardiovascular:  Negative for chest pain, palpitations and leg swelling.  Gastrointestinal:  Negative for abdominal pain, constipation, diarrhea, nausea and vomiting.  Endocrine: Negative.   Genitourinary: Negative.  Negative for difficulty urinating and dysuria.  Musculoskeletal:  Positive for  arthralgias. Negative for myalgias.  Skin: Negative.   Neurological:  Negative for dizziness, weakness, light-headedness and headaches.  Hematological:  Negative for adenopathy. Does not bruise/bleed easily.  Psychiatric/Behavioral: Negative.     Objective:  BP 106/68 (BP Location: Left Arm, Patient Position: Sitting, Cuff Size: Large)   Pulse 80   Temp 98 F (36.7 C) (Oral)   Resp 16   Ht 5' 5.5" (1.664 m)   Wt 122 lb (55.3 kg)   SpO2 95%   BMI 19.99 kg/m   BP Readings from Last 3 Encounters:  06/07/21 106/68  04/06/21 (!) 152/90    Wt Readings from Last 3 Encounters:  06/07/21 122 lb (55.3 kg)  05/31/21 119 lb 8 oz (54.2 kg)  04/06/21 119 lb (54 kg)    Physical Exam Vitals reviewed.  HENT:     Nose: Nose normal.     Mouth/Throat:     Mouth: Mucous membranes are moist.  Eyes:     Conjunctiva/sclera: Conjunctivae normal.  Cardiovascular:     Rate and Rhythm: Normal rate and regular rhythm.     Heart sounds: No murmur heard. Pulmonary:     Effort: Pulmonary effort is normal.     Breath sounds: No stridor. No wheezing, rhonchi or rales.  Abdominal:     General: Abdomen is flat.     Palpations: There is no mass.     Tenderness: There is no abdominal tenderness. There is no guarding.  Musculoskeletal:        General: Normal range of motion.     Cervical back: Neck supple.  Right lower leg: No edema.     Left lower leg: No edema.     Right ankle: Normal. No swelling.     Left ankle: Normal. No swelling or deformity. No tenderness. Normal range of motion.  Lymphadenopathy:     Cervical: No cervical adenopathy.  Skin:    General: Skin is warm.  Neurological:     General: No focal deficit present.     Mental Status: He is alert. Mental status is at baseline.  Psychiatric:        Mood and Affect: Mood normal.    Lab Results  Component Value Date   WBC 3.0 (L) 06/07/2021   HGB 11.0 (L) 06/07/2021   HCT 33.3 (L) 06/07/2021   PLT 223.0 06/07/2021    GLUCOSE 87 06/07/2021   CHOL 112 04/06/2021   TRIG 105.0 04/06/2021   HDL 54.00 04/06/2021   LDLCALC 37 04/06/2021   ALT 9 04/06/2021   AST 20 04/06/2021   NA 139 06/07/2021   K 4.1 06/07/2021   CL 102 06/07/2021   CREATININE 1.09 06/07/2021   BUN 17 06/07/2021   CO2 31 06/07/2021   TSH 2.57 04/06/2021   PSA 4.13 (H) 06/07/2021   INR 1.1 ratio (H) 08/25/2009    CT CHEST LUNG CA SCREEN LOW DOSE W/O CM  Result Date: 06/04/2021 CLINICAL DATA:  70 year old male with 23 pack-year history of smoking. Lung cancer screening. EXAM: CT CHEST WITHOUT CONTRAST LOW-DOSE FOR LUNG CANCER SCREENING TECHNIQUE: Multidetector CT imaging of the chest was performed following the standard protocol without IV contrast. COMPARISON:  None. FINDINGS: Cardiovascular: The heart size is normal. No substantial pericardial effusion. Mild atherosclerotic calcification is noted in the wall of the thoracic aorta. Mediastinum/Nodes: No mediastinal lymphadenopathy. No evidence for gross hilar lymphadenopathy although assessment is limited by the lack of intravenous contrast on the current study. The esophagus has normal imaging features. There is no axillary lymphadenopathy. Lungs/Pleura: Scattered tiny calcified centrilobular emphsyema noted. And noncalcified pulmonary nodules are identified. There is an irregular nodular opacity in the right apex with volume derived equivalent diameter of 9.2 mm. No focal airspace consolidation. No pleural effusion. Upper Abdomen: Unremarkable. Musculoskeletal: No worrisome lytic or sclerotic osseous abnormality. IMPRESSION: 1. Lung-RADS 4A, suspicious. Follow up low-dose chest CT without contrast in 3 months (please use the following order, "CT CHEST LCS NODULE FOLLOW-UP W/O CM") is recommended. Alternatively, PET may be considered when there is a solid component 73mm or larger. 2. 9.2 mm irregular nodular opacity in the right lung apex, representing the nodule of concern. This is likely scar,  but given baseline study and size of this finding, close follow-up recommended. 3.  Emphysema (ICD10-J43.9) and Aortic Atherosclerosis (ICD10-170.0) These results will be called to the ordering clinician or representative by the Radiologist Assistant, and communication documented in the PACS or [employer]. Electronically Signed   By: Misty Stanley M.D.   On: 06/04/2021 07:40   Assessment & Plan:   Keith [name] was seen today for hypertension and hyperlipidemia.  Diagnoses and all orders for this visit:  Dyslipidemia, goal LDL below 100- LDL goal achieved. Doing well on the statin  -     CK; Future -     CK  Primary hypertension- His blood pressure is overcontrolled.  Will discontinue indapamide. -     CBC with Differential/Platelet; Future -     Basic metabolic panel; Future -     Magnesium; Future -     Magnesium -  Basic metabolic panel -     CBC with Differential/Platelet  Acute left ankle pain-work-up for secondary causes is unremarkable. -     CBC with Differential/Platelet; Future -     CK; Future -     Sedimentation rate; Future -     Uric acid; Future -     Uric acid -     Sedimentation rate -     CK -     CBC with Differential/Platelet  PSA elevation- His PSA is declining.  This is reassuring. -     PSA; Future -     PSA  Need for immunization against influenza -     Flu Vaccine QUAD High Dose(Fluad)  Hypomagnesemia syndrome -     Magnesium Oxide 400 MG CAPS; Take 1 capsule (400 mg total) by mouth daily.  I have discontinued Clary Dokken's gabapentin and indapamide. I am also having him start on Magnesium Oxide. Additionally, I am having him maintain his rosuvastatin, Cholecalciferol, and Golytely.  Meds ordered this encounter  Medications   Magnesium Oxide 400 MG CAPS    Sig: Take 1 capsule (400 mg total) by mouth daily.    Dispense:  90 capsule    Refill:  0      Follow-up: Return in about 3 months (around 09/06/2021).  Scarlette Calico, MD

## 2021-06-07 NOTE — Patient Instructions (Signed)

## 2021-06-07 NOTE — Telephone Encounter (Signed)
Noted by triage.  

## 2021-06-13 ENCOUNTER — Other Ambulatory Visit: Payer: Self-pay

## 2021-06-13 ENCOUNTER — Ambulatory Visit (AMBULATORY_SURGERY_CENTER): Payer: Medicare HMO | Admitting: Gastroenterology

## 2021-06-13 ENCOUNTER — Encounter: Payer: Self-pay | Admitting: Gastroenterology

## 2021-06-13 VITALS — BP 130/75 | HR 66 | Temp 97.5°F | Resp 11 | Ht 65.0 in | Wt 119.5 lb

## 2021-06-13 DIAGNOSIS — K649 Unspecified hemorrhoids: Secondary | ICD-10-CM | POA: Diagnosis not present

## 2021-06-13 DIAGNOSIS — Z1211 Encounter for screening for malignant neoplasm of colon: Secondary | ICD-10-CM | POA: Diagnosis not present

## 2021-06-13 DIAGNOSIS — J449 Chronic obstructive pulmonary disease, unspecified: Secondary | ICD-10-CM | POA: Diagnosis not present

## 2021-06-13 DIAGNOSIS — K5730 Diverticulosis of large intestine without perforation or abscess without bleeding: Secondary | ICD-10-CM | POA: Diagnosis not present

## 2021-06-13 DIAGNOSIS — I10 Essential (primary) hypertension: Secondary | ICD-10-CM | POA: Diagnosis not present

## 2021-06-13 MED ORDER — SODIUM CHLORIDE 0.9 % IV SOLN
500.0000 mL | Freq: Once | INTRAVENOUS | Status: DC
Start: 2021-06-13 — End: 2021-06-13

## 2021-06-13 NOTE — Patient Instructions (Signed)
YOU HAD AN ENDOSCOPIC PROCEDURE TODAY AT [facility]:   Refer to the procedure report that was given to you for any specific questions about what was found during the examination.  If the procedure report does not answer your questions, please call your gastroenterologist to clarify.  If you requested that your care partner not be given the details of your procedure findings, then the procedure report has been included in a sealed envelope for you to review at your convenience later.  YOU SHOULD EXPECT: Some feelings of bloating in the abdomen. Passage of more gas than usual.  Walking can help get rid of the air that was put into your GI tract during the procedure and reduce the bloating. If you had a lower endoscopy (such as a colonoscopy or flexible sigmoidoscopy) you may notice spotting of blood in your stool or on the toilet paper. If you underwent a bowel prep for your procedure, you may not have a normal bowel movement for a few days.  Please Note:  You might notice some irritation and congestion in your nose or some drainage.  This is from the oxygen used during your procedure.  There is no need for concern and it should clear up in a day or so.  SYMPTOMS TO REPORT IMMEDIATELY:  Following lower endoscopy (colonoscopy or flexible sigmoidoscopy):  Excessive amounts of blood in the stool  Significant tenderness or worsening of abdominal pains  Swelling of the abdomen that is new, acute  Fever of 100F or higher   For urgent or emergent issues, a gastroenterologist can be reached at any hour by calling [phone]. Do not use MyChart messaging for urgent concerns.    DIET:  We do recommend a small meal at first, but then you may proceed to your regular diet.  Drink plenty of fluids but you should avoid alcoholic beverages for 24 hours.  ACTIVITY:  You should plan to take it easy for the rest of today and you should NOT DRIVE or use heavy machinery until tomorrow (because  of the sedation medicines used during the test).    FOLLOW UP: Our staff will call the number listed on your records 48-72 hours following your procedure to check on you and address any questions or concerns that you may have regarding the information given to you following your procedure. If we do not reach you, we will leave a message.  We will attempt to reach you two times.  During this call, we will ask if you have developed any symptoms of COVID 19. If you develop any symptoms (ie: fever, flu-like symptoms, shortness of breath, cough etc.) before then, please call [phone].  If you test positive for Covid 19 in the 2 weeks post procedure, please call and report this information to [place].    If any biopsies were taken you will be contacted by phone or by letter within the next 1-3 weeks.  Please call us at [phone] if you have not heard about the biopsies in 3 weeks.    SIGNATURES/CONFIDENTIALITY: You and/or your care partner have signed paperwork which will be entered into your electronic medical record.  These signatures attest to the fact that that the information above on your After Visit Summary has been reviewed and is understood.  Full responsibility of the confidentiality of this discharge information lies with you and/or your care-partner.    Resume medications.Information given on diverticulosis. Recommend RUQ Ultrasound,office will call to schedule.

## 2021-06-13 NOTE — Op Note (Signed)
[facility] Patient Name: Keith [name] Procedure Date: 06/13/2021 11:44 AM MRN: [id number] Endoscopist: Nicki Reaper E. Candis Schatz , MD Age: 70 Referring MD:  Date of Birth: 03-Jun-1951 Gender: Male Account #: [id number] Procedure:                Colonoscopy Indications:              Screening for colorectal malignant neoplasm Medicines:                Monitored Anesthesia Care Procedure:                Pre-Anesthesia Assessment:                           - Prior to the procedure, a History and Physical                            was performed, and patient medications and                            allergies were reviewed. The patient's tolerance of                            previous anesthesia was also reviewed. The risks                            and benefits of the procedure and the sedation                            options and risks were discussed with the patient.                            All questions were answered, and informed consent                            was obtained. Prior Anticoagulants: The patient has                            taken no previous anticoagulant or antiplatelet                            agents. ASA Grade Assessment: III - A patient with                            severe systemic disease. After reviewing the risks                            and benefits, the patient was deemed in                            satisfactory condition to undergo the procedure.                           After obtaining informed consent, the colonoscope  was passed under direct vision. Throughout the                            procedure, the patient's blood pressure, pulse, and                            oxygen saturations were monitored continuously. The                            PCF-HQ190L Colonoscope was introduced through the                            anus and advanced to the the terminal ileum, with                             identification of the appendiceal orifice and IC                            valve. The colonoscopy was somewhat difficult due                            to a tortuous colon. Successful completion of the                            procedure was aided by using water insufflation, a                            pediatric colonoscope and manual pressure. The                            patient tolerated the procedure well. The quality                            of the bowel preparation was good. The terminal                            ileum, ileocecal valve, appendiceal orifice, and                            rectum were photographed. Scope In: 11:57:32 AM Scope Out: 12:16:38 PM Scope Withdrawal Time: 0 hours 10 minutes 20 seconds  Total Procedure Duration: 0 hours 19 minutes 6 seconds  Findings:                 The perianal and digital rectal examinations were                            normal. Pertinent negatives include normal                            sphincter tone and no palpable rectal lesions.                           A few small and large-mouthed diverticula were  found in the sigmoid colon and ascending colon.                           The left colon was moderately tortuous.                           The exam was otherwise normal throughout the                            examined colon.                           The terminal ileum appeared normal.                           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                           No additional abnormalities were found on                            retroflexion. Complications:            No immediate complications. Estimated Blood Loss:     Estimated blood loss: none. Impression:               - Diverticulosis in the sigmoid colon and in the                            ascending colon.                           - Tortuous colon.                           - The examined portion of the ileum was  normal.                           - Rectal varices.                           - No specimens collected. Recommendation:           - Patient has a contact number available for                            emergencies. The signs and symptoms of potential                            delayed complications were discussed with the                            patient. Return to normal activities tomorrow.                            Written discharge instructions were provided to the  patient.                           - Resume previous diet.                           - Continue present medications.                           - Perform a RUQ ultrasound at appointment to be                            scheduled to assess for cirrhosis.                           - Recommend against further screening colonoscopies                            given normal study and patient's age. Mattelyn Imhoff E. Candis Schatz, MD 06/13/2021 12:28:00 PM This report has been signed electronically.

## 2021-06-13 NOTE — Progress Notes (Signed)
Report given to PACU, vss 

## 2021-06-13 NOTE — Progress Notes (Signed)
[facility] History and Physical   Primary Care Physician:  Janith Lima, MD   Reason for Procedure:   Colon cancer screening  Plan:    Screening colonoscopy     HPI: Keith [name] is a 70 y.o. male undergoing average risk screening colonoscopy.  His last colonoscopy was in 2009 and was normal.  He has no chronic GI symptoms and no family history of colon cancer.   Past Medical History:  Diagnosis Date   Chicken pox    COPD (chronic obstructive pulmonary disease) ([place])    patient denies   Hyperlipidemia    on meds   Hypertension     Past Surgical History:  Procedure Laterality Date   COLONOSCOPY  2009   Dr.Orr-F/V-prep unknown-tortuous sigm colon-    Prior to Admission medications   Medication Sig Start Date End Date Taking? Authorizing Provider  Cholecalciferol 1.25 MG (50000 UT) capsule Take 1 capsule (50,000 Units total) by mouth once a week. 05/11/21  Yes Janith Lima, MD  Magnesium Oxide 400 MG CAPS Take 1 capsule (400 mg total) by mouth daily. 06/07/21  Yes Janith Lima, MD  polyethylene glycol (GOLYTELY) 236 g solution Take 4,000 mLs by mouth as directed. 05/31/21  Yes Daryel November, MD  rosuvastatin (CRESTOR) 5 MG tablet Take 1 tablet (5 mg total) by mouth daily. 04/10/21  Yes Janith Lima, MD    Current Outpatient Medications  Medication Sig Dispense Refill   Cholecalciferol 1.25 MG (50000 UT) capsule Take 1 capsule (50,000 Units total) by mouth once a week. 12 capsule 0   Magnesium Oxide 400 MG CAPS Take 1 capsule (400 mg total) by mouth daily. 90 capsule 0   polyethylene glycol (GOLYTELY) 236 g solution Take 4,000 mLs by mouth as directed. 4000 mL 0   rosuvastatin (CRESTOR) 5 MG tablet Take 1 tablet (5 mg total) by mouth daily. 90 tablet 1   Current Facility-Administered Medications  Medication Dose Route Frequency Provider Last Rate Last Admin   0.9 %  sodium chloride infusion  500 mL Intravenous Once Daryel November, MD         Allergies as of 06/13/2021   (No Known Allergies)    Family History  Problem Relation Age of Onset   Hypertension Mother    Heart attack Mother    Hypertension Father    Heart attack Father    Heart attack Sister    Pancreatic cancer Brother    Alcohol abuse Brother    Alcohol abuse Brother    Hypertension Brother    Lung cancer Brother    Colon polyps Neg Hx    Colon cancer Neg Hx    Esophageal cancer Neg Hx    Stomach cancer Neg Hx    Rectal cancer Neg Hx     Social History   Socioeconomic History   Marital status: Married    Spouse name: Not on file   Number of children: Not on file   Years of education: Not on file   Highest education level: Not on file  Occupational History   Not on file  Tobacco Use   Smoking status: Every Day    Packs/day: 0.50    Years: 40.00    Pack years: 20.00    Types: Cigarettes    Start date: 02/15/1969   Smokeless tobacco: Never  Vaping Use   Vaping Use: Never used  Substance and Sexual Activity   Alcohol use: Yes    Alcohol/week: 2.0  standard drinks    Types: 2 Standard drinks or equivalent per week   Drug use: Never   Sexual activity: Not Currently    Partners: Female  Other Topics Concern   Not on file  Social History Narrative   Not on file   Social Determinants of Health   Financial Resource Strain: Low Risk    Difficulty of Paying Living Expenses: Not very hard  Food Insecurity: Not on file  Transportation Needs: Not on file  Physical Activity: Not on file  Stress: Not on file  Social Connections: Not on file  Intimate Partner Violence: Not on file    Review of Systems:  All other review of systems negative except as mentioned in the HPI.  Physical Exam: Vital signs BP (!) 115/57   Pulse (!) 51   Temp (!) 97.5 F (36.4 C) (Temporal)   Ht 5\' 5"  (1.651 m)   Wt 119 lb 8 oz (54.2 kg)   SpO2 100%   BMI 19.89 kg/m   General:   Alert,  Well-developed, well-nourished, pleasant and cooperative in  NAD. MP2, dentures Lungs:  Clear throughout to auscultation.   Heart:  Regular rate and rhythm; no murmurs, clicks, rubs,  or gallops. Abdomen:  Soft, nontender and nondistended. Normal bowel sounds.   Neuro/Psych:  Normal mood and affect. A and O x 3   Cullan Launer E. Candis Schatz, MD [facility]

## 2021-06-13 NOTE — Progress Notes (Signed)
1203 Ephedrine 10 mg given IV due to low BP, MD updated.

## 2021-06-15 ENCOUNTER — Telehealth: Payer: Self-pay | Admitting: *Deleted

## 2021-06-15 DIAGNOSIS — N401 Enlarged prostate with lower urinary tract symptoms: Secondary | ICD-10-CM | POA: Diagnosis not present

## 2021-06-15 DIAGNOSIS — R972 Elevated prostate specific antigen [PSA]: Secondary | ICD-10-CM | POA: Diagnosis not present

## 2021-06-15 DIAGNOSIS — R350 Frequency of micturition: Secondary | ICD-10-CM | POA: Diagnosis not present

## 2021-06-15 NOTE — Telephone Encounter (Signed)
  Follow up Call-  Call back number 06/13/2021  Post procedure Call Back phone  # [phone]  Permission to leave phone message Yes  Some recent data might be hidden     Patient questions:  Message left to callus if necessary.

## 2021-06-15 NOTE — Telephone Encounter (Signed)
  Follow up Call-  Call back number 06/13/2021  Post procedure Call Back phone  # [phone]  Permission to leave phone message Yes  Some recent data might be hidden     Patient questions:  Do you have a fever, pain , or abdominal swelling? No. Pain Score  0 *  Have you tolerated food without any problems? Yes.    Have you been able to return to your normal activities? Yes.    Do you have any questions about your discharge instructions: Diet   No. Medications  No. Follow up visit  No.  Do you have questions or concerns about your Care? No.  Actions: * If pain score is 4 or above: No action needed, pain <4.  Have you developed a fever since your procedure? no  2.   Have you had an respiratory symptoms (SOB or cough) since your procedure? no  3.   Have you tested positive for COVID 19 since your procedure no  4.   Have you had any family members/close contacts diagnosed with the COVID 19 since your procedure?  no   If yes to any of these questions please route to Joylene John, RN and Joella Prince, RN

## 2021-06-16 ENCOUNTER — Other Ambulatory Visit: Payer: Self-pay

## 2021-06-16 DIAGNOSIS — K649 Unspecified hemorrhoids: Secondary | ICD-10-CM

## 2021-06-21 ENCOUNTER — Telehealth: Payer: Self-pay

## 2021-06-21 NOTE — Progress Notes (Signed)
Cardiology Office Note   Date:  06/22/2021   ID:  [name], DOB [birth year], MRN [id number]  PCP:  Janith Lima, MD  Cardiologist:   None Referring:  Janith Lima, MD  Chief Complaint  Patient presents with   Shortness of Breath   Extremity Weakness       History of Present Illness: Keith [name] is a 70 y.o. male who is referred by Janith Lima, MD for evaluation of bradycardia.  He says he is really here because he is "cold natured."  He said he was not always like this.  This is gotten worse over time.  He feels like he does not have probably good blood flow in his feet and his fingers.  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  However, he can still trim his yard sometimes having to use a push mower.  He can walk about 50 yards before getting short of breath.  However, he will get short of breath at that point and this has been slowly progressive.  He does not describe chest pressure, neck or arm discomfort.   He does have occasional palpitations with his heart racing.  He is noted today to have bradycardia with heart rate in 58 but he has not had any presyncope or syncope.  He does not think he has ever had any prior cardiac history or work-up.  Of note he does describe not so much soreness within his legs which is what requires him to use a cane but just heaviness when he tries to walk.   Past Medical History:  Diagnosis Date   Chicken pox    COPD (chronic obstructive pulmonary disease) ([place])    patient denies   Hyperlipidemia    on meds   Hypertension     Past Surgical History:  Procedure Laterality Date   COLONOSCOPY  2009   Dr.Orr-F/V-prep unknown-tortuous sigm colon-     Current Outpatient Medications  Medication Sig Dispense Refill   Cholecalciferol 1.25 MG (50000 UT) capsule Take 1 capsule (50,000 Units total) by mouth once a week. 12 capsule 0   doxycycline 100 mg in sodium  chloride 0.9 % 250 mL Inject 100 mg into the vein every 12 (twelve) hours.     Magnesium Oxide 400 MG CAPS Take 1 capsule (400 mg total) by mouth daily. 90 capsule 0   rosuvastatin (CRESTOR) 5 MG tablet Take 1 tablet (5 mg total) by mouth daily. 90 tablet 1   polyethylene glycol (GOLYTELY) 236 g solution Take 4,000 mLs by mouth as directed. (Patient not taking: Reported on 06/22/2021) 4000 mL 0   No current facility-administered medications for this visit.    Allergies:   Patient has no known allergies.    Social History:  The patient  reports that he has been smoking cigarettes. He started smoking about 52 years ago. He has a 20.00 pack-year smoking history. He has never used smokeless tobacco. He reports current alcohol use of about 2.0 standard drinks per week. He reports that he does not use drugs.   Family History:  The patient's family history includes Alcohol abuse in his brother and brother; Heart attack in his father, mother, and sister; Hypertension in his brother, father, and mother; Lung cancer in his brother; Pancreatic cancer in his brother.   He has a vague history of heart disease he cannot remember any details but thinks  many of his first-degree relatives have had heart disease   ROS:  Please see the history of present illness.   Otherwise, review of systems are positive for none.   All other systems are reviewed and negative.    PHYSICAL EXAM: VS:  BP 110/68   Pulse (!) 58   Ht 5\' 5"  (1.651 m)   Wt 125 lb 12.8 oz (57.1 kg)   SpO2 99%   BMI 20.93 kg/m  , BMI Body mass index is 20.93 kg/m. GENERAL:  Well appearing HEENT:  Pupils equal round and reactive, fundi not visualized, oral mucosa unremarkable NECK:  No jugular venous distention, waveform within normal limits, carotid upstroke brisk and symmetric, no bruits, no thyromegaly LYMPHATICS:  No cervical, inguinal adenopathy LUNGS:  Clear to auscultation bilaterally BACK:  No CVA tenderness CHEST:   Unremarkable HEART:  PMI not displaced or sustained,S1 and S2 within normal limits, no S3, no S4, no clicks, no rubs, no murmurs ABD:  Flat, positive bowel sounds normal in frequency in pitch, no bruits, no rebound, no guarding, no midline pulsatile mass, no hepatomegaly, no splenomegaly EXT:  2 plus pulses upper and diminished dorsalis pedis and posterior tibialis bilateral lower, no edema, no cyanosis no clubbing SKIN:  No rashes no nodules NEURO:  Cranial nerves II through XII grossly intact, motor grossly intact throughout PSYCH:  Cognitively intact, oriented to person place and time    EKG:  EKG is ordered today. The ekg ordered today demonstrates sinus rhythm, rate 50, axis within normal limits, intervals within normal limits, no acute ST-T wave changes.   Recent Labs: 04/06/2021: ALT 9; TSH 2.57 06/07/2021: BUN 17; Creatinine, Ser 1.09; Hemoglobin 11.0; Magnesium 1.4; Platelets 223.0; Potassium 4.1; Sodium 139    Lipid Panel    Component Value Date/Time   CHOL 112 04/06/2021 1442   TRIG 105.0 04/06/2021 1442   HDL 54.00 04/06/2021 1442   CHOLHDL 2 04/06/2021 1442   VLDL 21.0 04/06/2021 1442   LDLCALC 37 04/06/2021 1442      Wt Readings from Last 3 Encounters:  06/22/21 125 lb 12.8 oz (57.1 kg)  06/13/21 119 lb 8 oz (54.2 kg)  06/07/21 122 lb (55.3 kg)      Other studies Reviewed: Additional studies/ records that were reviewed today include: Labs. Review of the above records demonstrates:  Please see elsewhere in the note.     ASSESSMENT AND PLAN:  BRADYCARDIA: He has some palpitations and lower heart rates.  I will apply a 3-day monitor.  I did notice electrolytes and TSH were normal.  Provided he does not have any significant bradycardia arrhythmias no further testing for this would be indicated.  SOB: He has this complaint along with significant cardiovascular risk factors.  He would be willing to try a POET (Plain Old Exercise Treadmill).    If we are unable to  do this he will need a coronary CTA.   TOBACCO USE: He smokes a few cigarettes a day and we talked about the need to stop smoking completely  LEG PAIN: He describes leg pain and has diminished dorsalis pedis and posterior tibialis pulses I would like to check ABIs.   Current medicines are reviewed at length with the patient today.  The patient does not have concerns regarding medicines.  The following changes have been made:  no change  Labs/ tests ordered today include:   Orders Placed This Encounter  Procedures   LONG TERM MONITOR (3-14 DAYS)   Exercise Tolerance Test  EKG 12-Lead   VAS [place] LOWER EXTREMITY ARTERIAL DUPLEX   VAS [place] ABI WITH/WO TBI      Disposition:   FU with me based on the results of the above.     Signed, Minus Breeding, MD  06/22/2021 12:01 PM    [facility]

## 2021-06-21 NOTE — Progress Notes (Signed)
Keith [name] [id number] and during the call he stated he thought his BP was 170/90. He called back and reported that his BP actually ranges from (L)116/74 and (R)118/76.

## 2021-06-21 NOTE — Progress Notes (Deleted)
error 

## 2021-06-21 NOTE — Chronic Care Management (AMB) (Addendum)
Chronic Care Management Pharmacy Assistant   Name: Keith [name]  MRN: [id number] DOB: 1951/04/08  Keith [name] is an 70 y.o. year old male who presents for his follow-up CCM visit with the clinical pharmacist.  Reason for Encounter: Disease State HTN     Recent office visits:  06/07/21 Janith Lima MD PCP- pt was seen for Dyslipidemia and HTN. Pt had labs ordered and received high dose flu vaccine. Provider started Magnesium Oxide 400 MG daily. Follow up in 3 months.  Recent consult visits:  06/01/21 [facility]- pt was seen for Tobacco use. Lung cancer screening program. Will f/u in 3 days for results.  Hospital visits:  None in previous 6 months  Medications: Outpatient Encounter Medications as of 06/21/2021  Medication Sig   Cholecalciferol 1.25 MG (50000 UT) capsule Take 1 capsule (50,000 Units total) by mouth once a week.   Magnesium Oxide 400 MG CAPS Take 1 capsule (400 mg total) by mouth daily.   polyethylene glycol (GOLYTELY) 236 g solution Take 4,000 mLs by mouth as directed.   rosuvastatin (CRESTOR) 5 MG tablet Take 1 tablet (5 mg total) by mouth daily.   No facility-administered encounter medications on file as of 06/21/2021.    Reviewed chart prior to disease state call. Spoke with patient regarding BP  Recent Office Vitals: BP Readings from Last 3 Encounters:  06/13/21 130/75  06/07/21 106/68  04/06/21 (!) 152/90   Pulse Readings from Last 3 Encounters:  06/13/21 66  06/07/21 80  04/06/21 (!) 51    Wt Readings from Last 3 Encounters:  06/13/21 119 lb 8 oz (54.2 kg)  06/07/21 122 lb (55.3 kg)  05/31/21 119 lb 8 oz (54.2 kg)     Kidney Function Lab Results  Component Value Date/Time   CREATININE 1.09 06/07/2021 03:55 PM   CREATININE 0.96 04/06/2021 02:42 PM   GFR 68.94 06/07/2021 03:55 PM    BMP Latest Ref Rng & Units 06/07/2021 04/06/2021  Glucose 70 - 99 mg/dL 87 79  BUN 6 - 23 mg/dL 17 12  Creatinine 0.40 - 1.50 mg/dL 1.09  0.96  Sodium 135 - 145 mEq/L 139 141  Potassium 3.5 - 5.1 mEq/L 4.1 4.4  Chloride 96 - 112 mEq/L 102 103  CO2 19 - 32 mEq/L 31 29  Calcium 8.4 - 10.5 mg/dL 9.6 9.6    Current antihypertensive regimen:  Indapamide 1.25mg  - 1 tablet daily   How often are you checking your Blood Pressure? 1-2x per week pt stated he doesn't have his own BP cuff and he uses a friends when he's able to.  Current home BP readings: ranges from (L)116/74 and (R)118/76.  What recent interventions/DTPs have been made by any provider to improve Blood Pressure control since last CPP Visit: N/A  Any recent hospitalizations or ED visits since last visit with CPP? No  What diet changes have been made to improve Blood Pressure Control?  Pt stated he has cut back on his salt and increased water intake but is having swelling in both legs.  What exercise is being done to improve your Blood Pressure Control?  Pt stated he walks around the yard and in the house but he's not very active.  Adherence Review: Is the patient currently on ACE/ARB medication? Yes Does the patient have >5 day gap between last estimated fill dates? No    Star Rating Drugs: rosuvastatin (CRESTOR) 5 MG tablet last fill 04/10/21 90 DS   Richmond Campbell CMA Clinical  Pharmacist Assistant [phone]

## 2021-06-22 ENCOUNTER — Ambulatory Visit (INDEPENDENT_AMBULATORY_CARE_PROVIDER_SITE_OTHER): Payer: Medicare HMO | Admitting: Cardiology

## 2021-06-22 ENCOUNTER — Encounter: Payer: Self-pay | Admitting: Cardiology

## 2021-06-22 ENCOUNTER — Ambulatory Visit (INDEPENDENT_AMBULATORY_CARE_PROVIDER_SITE_OTHER): Payer: Medicare HMO

## 2021-06-22 ENCOUNTER — Other Ambulatory Visit: Payer: Self-pay | Admitting: *Deleted

## 2021-06-22 ENCOUNTER — Ambulatory Visit (HOSPITAL_COMMUNITY): Payer: Medicare HMO

## 2021-06-22 ENCOUNTER — Other Ambulatory Visit: Payer: Self-pay

## 2021-06-22 VITALS — BP 110/68 | HR 58 | Ht 65.0 in | Wt 125.8 lb

## 2021-06-22 DIAGNOSIS — R0602 Shortness of breath: Secondary | ICD-10-CM | POA: Diagnosis not present

## 2021-06-22 DIAGNOSIS — R001 Bradycardia, unspecified: Secondary | ICD-10-CM

## 2021-06-22 DIAGNOSIS — R0989 Other specified symptoms and signs involving the circulatory and respiratory systems: Secondary | ICD-10-CM | POA: Diagnosis not present

## 2021-06-22 NOTE — Progress Notes (Unsigned)
Patient enrolled for [employer] to mail a 3 day ZIO XT message.

## 2021-06-22 NOTE — Patient Instructions (Addendum)
Medication Instructions:  Your physician recommends that you continue on your current medications as directed. Please refer to the Current Medication list given to you today.   *If you need a refill on your cardiac medications before your next appointment, please call your pharmacy*  Lab Work: NONE   Testing/Procedures: Your physician has requested that you have an ankle brachial index (ABI). During this test an ultrasound and blood pressure cuff are used to evaluate the arteries that supply the arms and legs with blood. Allow thirty minutes for this exam. There are no restrictions or special instructions.  Your physician has requested that you have a lower or upper extremity arterial duplex. This test is an ultrasound of the arteries in the legs or arms. It looks at arterial blood flow in the legs and arms. Allow one hour for Lower and Upper Arterial scans. There are no restrictions or special instructions  Your physician has requested that you have an exercise tolerance test. For further information please visit [email or web address]. Please also follow instruction sheet, as given.  3 DAY ZIO, THIS WILL BE MAILED TO YOU   Follow-Up: At [facility], you and your health needs are our priority.  As part of our continuing mission to provide you with exceptional heart care, we have created designated Provider Care Teams.  These Care Teams include your primary Cardiologist (physician) and Advanced Practice Providers (APPs -  Physician Assistants and Nurse Practitioners) who all work together to provide you with the care you need, when you need it.  We recommend signing up for the patient portal called "MyChart".  Sign up information is provided on this After Visit Summary.  MyChart is used to connect with patients for Virtual Visits (Telemedicine).  Patients are able to view lab/test results, encounter notes, upcoming appointments, etc.  Non-urgent messages can be sent to your provider as well.    To learn more about what you can do with MyChart, go to [email or web address].    Your next appointment:   BASED ON THE RESULTS OF THE ABOVE TESTING   ZIO XT- Long Term Monitor Instructions  Your physician has requested you wear a ZIO patch monitor for 3 days.  This is a single patch monitor. [employer] supplies one patch monitor per enrollment. Additional stickers are not available. Please do not apply patch if you will be having a Nuclear Stress Test,  Echocardiogram, Cardiac CT, MRI, or Chest Xray during the period you would be wearing the  monitor. The patch cannot be worn during these tests. You cannot remove and re-apply the  ZIO XT patch monitor.  Your ZIO patch monitor will be mailed 3 day USPS to your address on file. It may take 3-5 days  to receive your monitor after you have been enrolled.  Once you have received your monitor, please review the enclosed instructions. Your monitor  has already been registered assigning a specific monitor serial # to you.  Billing and Patient Assistance Program Information  We have supplied [employer] with any of your insurance information on file for billing purposes. [employer] offers a sliding scale Patient Assistance Program for patients that do not have  insurance, or whose insurance does not completely cover the cost of the ZIO monitor.  You must apply for the Patient Assistance Program to qualify for this discounted rate.  To apply, please call [employer] at [phone], select option 4, select option 2, ask to apply for  Patient Assistance Program. Theodore Demark will ask your household income, and how many  people  are in your household. They will quote your out-of-pocket cost based on that information.  [employer] will also be able to set up a 85-month, interest-free payment plan if needed.  Applying the monitor   Shave hair from upper left chest.  Hold abrader disc by orange tab. Rub abrader in 40 strokes over the upper left chest as  indicated in  your monitor instructions.  Clean area with 4 enclosed alcohol pads. Let dry.  Apply patch as indicated in monitor instructions. Patch will be placed under collarbone on left  side of chest with arrow pointing upward.  Rub patch adhesive wings for 2 minutes. Remove white label marked "1". Remove the white  label marked "2". Rub patch adhesive wings for 2 additional minutes.  While looking in a mirror, press and release button in center of patch. A small green light will  flash 3-4 times. This will be your only indicator that the monitor has been turned on.  Do not shower for the first 24 hours. You may shower after the first 24 hours.  Press the button if you feel a symptom. You will hear a small click. Record Date, Time and  Symptom in the Patient Logbook.  When you are ready to remove the patch, follow instructions on the last 2 pages of Patient  Logbook. Stick patch monitor onto the last page of Patient Logbook.  Place Patient Logbook in the blue and white box. Use locking tab on box and tape box closed  securely. The blue and white box has prepaid postage on it. Please place it in the mailbox as  soon as possible. Your physician should have your test results approximately 7 days after the  monitor has been mailed back to [facility].  Call [place] at [phone] if you have questions regarding  your ZIO XT patch monitor. Call them immediately if you see an orange light blinking on your  monitor.  If your monitor falls off in less than 4 days, contact our Monitor department at [phone].  If your monitor becomes loose or falls off after 4 days call [employer] at [phone] for  suggestions on securing your monitor

## 2021-06-23 ENCOUNTER — Ambulatory Visit: Payer: Medicare HMO | Admitting: Neurology

## 2021-06-23 ENCOUNTER — Encounter: Payer: Self-pay | Admitting: Neurology

## 2021-06-23 VITALS — BP 103/70 | HR 58 | Ht 65.5 in | Wt 124.0 lb

## 2021-06-23 DIAGNOSIS — R261 Paralytic gait: Secondary | ICD-10-CM

## 2021-06-23 DIAGNOSIS — R292 Abnormal reflex: Secondary | ICD-10-CM

## 2021-06-23 DIAGNOSIS — G959 Disease of spinal cord, unspecified: Secondary | ICD-10-CM

## 2021-06-23 NOTE — Progress Notes (Signed)
[facility] Clinic Note - Initial Visit   Date: 06/23/21  Keith [name] MRN: [id number] DOB: [birth year]   Dear Dr. Ronnald Ramp:  Thank you for your kind referral of Keith [name] for consultation of bilateral leg pain. Although his history is well known to you, please allow [place] to reiterate it for the purpose of our medical record. The patient was accompanied to the clinic by self.    History of Present Illness: Keith [name] is a 70 y.o. right-handed male with hyperlipidemia presenting for evaluation of bilateral leg weakness and gait instability.  Starting around 2019, he began having bilateral leg weakness, which is worse on the left.  He feels that his legs drag and cannot raise the legs high, such as when trying to put on his pants.  He needs to sit down to put is pants on.  He also complains of numbness/tingling in the feet, lower legs, and hands.  He has cramping in the legs.  He has been walking with a came for the past 2 years.  He has no hand any falls.  No back pain. No bowel/bladder dysfunction.  He retired Horticulturist, commercial. Lives with wife. No history of heavy alcohol use, diabetes, or neck/back injury.    Out-side paper records, electronic medical record, and images have been reviewed where available and summarized as:  Lab Results  Component Value Date   TSH 2.57 04/06/2021   Lab Results  Component Value Date   ESRSEDRATE 15 06/07/2021    Past Medical History:  Diagnosis Date   Chicken pox    COPD (chronic obstructive pulmonary disease) ([place])    patient denies   Hyperlipidemia    on meds   Hypertension     Past Surgical History:  Procedure Laterality Date   COLONOSCOPY  2009   Dr.Orr-F/V-prep unknown-tortuous sigm colon-     Medications:  Outpatient Encounter Medications as of 06/23/2021  Medication Sig   Cholecalciferol 1.25 MG (50000 UT) capsule Take 1 capsule (50,000 Units total) by mouth once a week.   doxycycline 100  mg in sodium chloride 0.9 % 250 mL Inject 100 mg into the vein every 12 (twelve) hours.   Magnesium Oxide 400 MG CAPS Take 1 capsule (400 mg total) by mouth daily.   rosuvastatin (CRESTOR) 5 MG tablet Take 1 tablet (5 mg total) by mouth daily.   [DISCONTINUED] polyethylene glycol (GOLYTELY) 236 g solution Take 4,000 mLs by mouth as directed. (Patient not taking: No sig reported)   No facility-administered encounter medications on file as of 06/23/2021.    Allergies: No Known Allergies  Family History: Family History  Problem Relation Age of Onset   Hypertension Mother    Heart attack Mother        No details   Hypertension Father    Heart attack Father        No details   Heart attack Sister        No details   Pancreatic cancer Brother    Alcohol abuse Brother    Alcohol abuse Brother    Hypertension Brother    Lung cancer Brother    Colon polyps Neg Hx    Colon cancer Neg Hx    Esophageal cancer Neg Hx    Stomach cancer Neg Hx    Rectal cancer Neg Hx     Social History: Social History   Tobacco Use   Smoking status: Every Day    Packs/day: 0.50    Years: 40.00  Pack years: 20.00    Types: Cigarettes    Start date: 02/15/1969   Smokeless tobacco: Never  Vaping Use   Vaping Use: Never used  Substance Use Topics   Alcohol use: Not Currently    Alcohol/week: 2.0 standard drinks    Types: 2 Standard drinks or equivalent per week    Comment: Patient quit drinking 03/2021   Drug use: Never   Social History   Social History Narrative   Lives with wife, retired.  2 children and one grand.     Lives in a one story    Right Handed, but patient can use left     Vital Signs:  BP 103/70   Pulse (!) 58   Ht 5' 5.5" (1.664 m)   Wt 124 lb (56.2 kg)   SpO2 99%   BMI 20.32 kg/m   Neurological Exam: MENTAL STATUS including orientation to time, place, person, recent and remote memory, attention span and concentration, language, and fund of knowledge is normal.   Speech is not dysarthric.  CRANIAL NERVES: II:  No visual field defects.   III-IV-VI: Pupils equal round and reactive to light.  Normal conjugate, extra-ocular eye movements in all directions of gaze.  No nystagmus.  No ptosis.   V:  Normal facial sensation.    VII:  Normal facial symmetry and movements.   VIII:  Reduced hearing to finger rub bilaterally.  Normal vestibular function.   IX-X:  Normal palatal movement.   XI:  Normal shoulder shrug and head rotation.   XII:  Normal tongue strength and range of motion, no deviation or fasciculation.  MOTOR:  Mild loss of muscle bulk in the quadriceps.  No fasciculations or abnormal movements.  No pronator drift.   Upper Extremity:  Right  Left  Deltoid  5/5   5/5   Biceps  5/5   5/5   Triceps  5/5   5/5   Infraspinatus 5/5  5/5  Medial pectoralis 5/5  5/5  Wrist extensors  5/5   5/5   Wrist flexors  5/5   5/5   Finger extensors  5/5   5/5   Finger flexors  5/5   5/5   Dorsal interossei  5/5   5/5   Abductor pollicis  5/5   5/5   Tone (Ashworth scale)  0  0   Lower Extremity:  Right  Left  Hip flexors  4/5   4/5   Hip extensors  5/5   5/5   Adductor 5-/5  5-/5  Abductor 5/5  5/5  Knee flexors  5/5   5/5   Knee extensors  5/5   5/5   Dorsiflexors  5/5   5/5   Plantarflexors  5/5   5/5   Toe extensors  5/5   5/5   Toe flexors  5/5   5/5   Tone (Ashworth scale)  0  0   MSRs:  Right        Left                  brachioradialis 3+  3+  biceps 2+  2+  triceps 2+  2+  patellar 3++  3++  ankle jerk 2+  2+  Hoffman no  no  plantar response up  up  Crossed adductors present bilaterally  SENSORY:  Vibration intact throughout.  Pin prick reduced over the left leg, intact in the arms.   COORDINATION/GAIT: Normal finger-to- nose-finger.  Toe tapping slowed bilaterally.  Able to rise from a chair without using arms.  Spastic gait, unassisted and unsteady.  Gait appears slightly more stable assisted with a  cane   IMPRESSION: Progressive spastic gait, leg weakness, and hyperreflexia concerning for myelopathy  -MRI cervical and lumbar spine wo will be ordered to evaluate for compressive pathology - ASAP  -Patient declined muscle relaxants, such as baclofen  -Fall precautions discussed  Further recommendations pending results.    Thank you for allowing me to participate in patient's care.  If I can answer any additional questions, I would be pleased to do so.    Sincerely,    [place] Festa K. Posey Pronto, DO

## 2021-06-23 NOTE — Patient Instructions (Signed)
MRI cervical spine and lumbar spine without contrast  We will call you with the results and let you know the next step

## 2021-06-25 ENCOUNTER — Ambulatory Visit
Admission: RE | Admit: 2021-06-25 | Discharge: 2021-06-25 | Disposition: A | Discharge status: Home / Self Care | Payer: Medicare HMO | Source: Ambulatory Visit | Attending: Neurology | Admitting: Neurology

## 2021-06-25 DIAGNOSIS — M48061 Spinal stenosis, lumbar region without neurogenic claudication: Secondary | ICD-10-CM | POA: Diagnosis not present

## 2021-06-25 DIAGNOSIS — M545 Low back pain, unspecified: Secondary | ICD-10-CM | POA: Diagnosis not present

## 2021-06-25 DIAGNOSIS — G959 Disease of spinal cord, unspecified: Secondary | ICD-10-CM

## 2021-06-25 IMAGING — MR MR LUMBAR SPINE W/O CM
4 of 5 series · 27 of 48 positions shown · non-contrast
Comparison: None.

CLINICAL DATA: Low back pain radiating to both legs with tingling
in the feet.

EXAM:
MRI LUMBAR SPINE WITHOUT CONTRAST
TECHNIQUE: Multiplanar, multisequence MR imaging of the lumbar spine was
performed. No intravenous contrast was administered.

[Series 2: T2 · sagittal · 4.0mm · 1.09mm/px · 6 of 17 slices shown (1 of 2)]
[im 1/17]
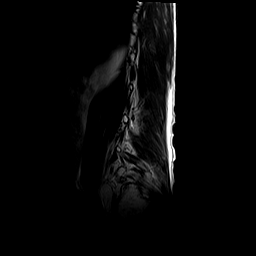
[im 4/17]
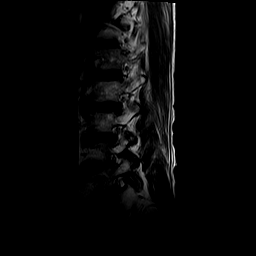
[im 7/17]
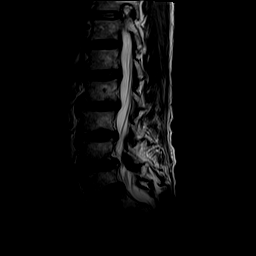
[im 10/17]
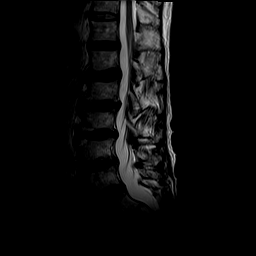
[im 13/17]
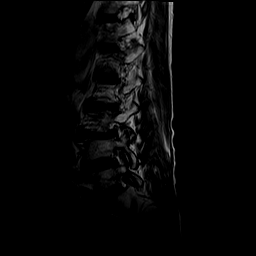
[im 17/17]
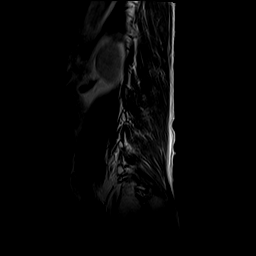

[Series 4: T1 · sagittal · 4.0mm · 1.09mm/px · 6 of 17 slices shown (1 of 2)]
[im 1/17]
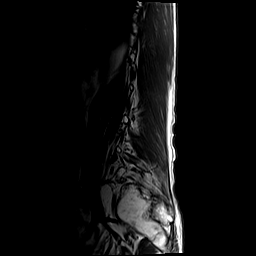
[im 4/17]
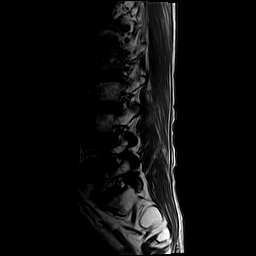
[im 7/17]
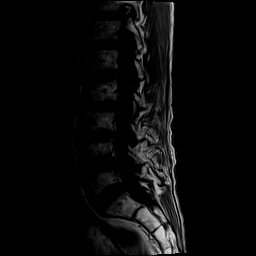
[im 10/17]
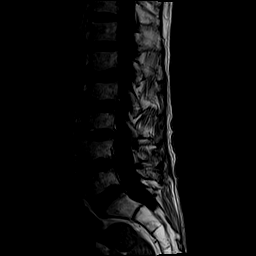
[im 13/17]
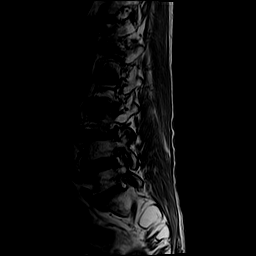
[im 17/17]
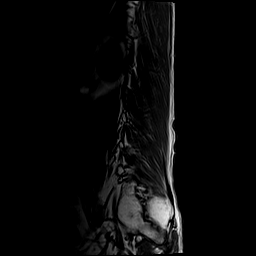

[Series 5: T2 · axial · 4.0mm · 0.39mm/px · z∈[-141,+69]mm · 9 of 41 slices shown (2 of 2)]
[im 1/41]
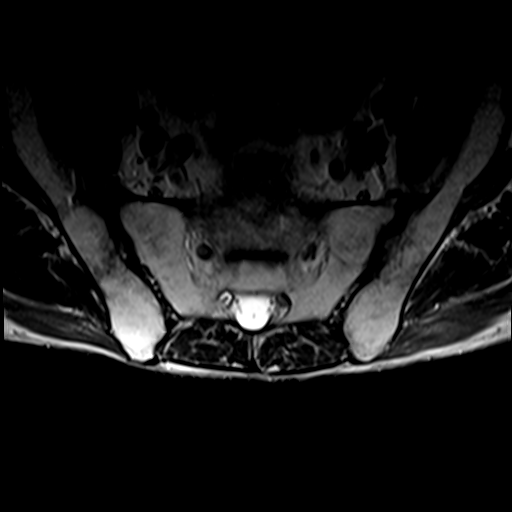
[im 6/41]
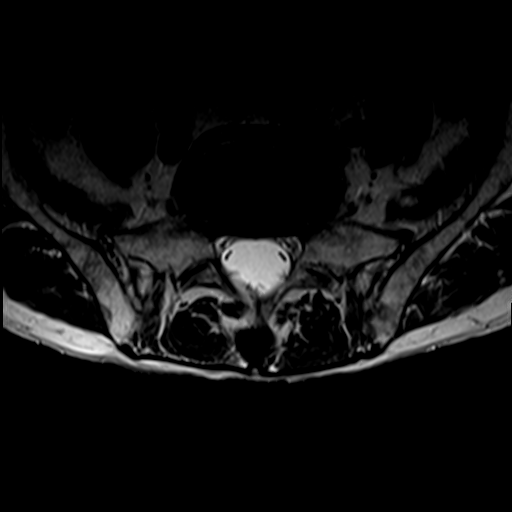
[im 12/41]
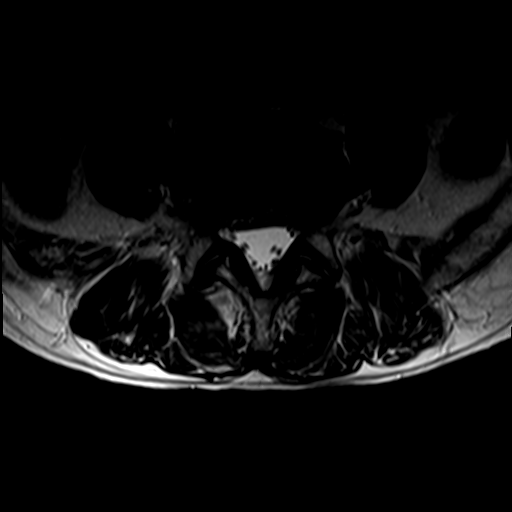
[im 18/41]
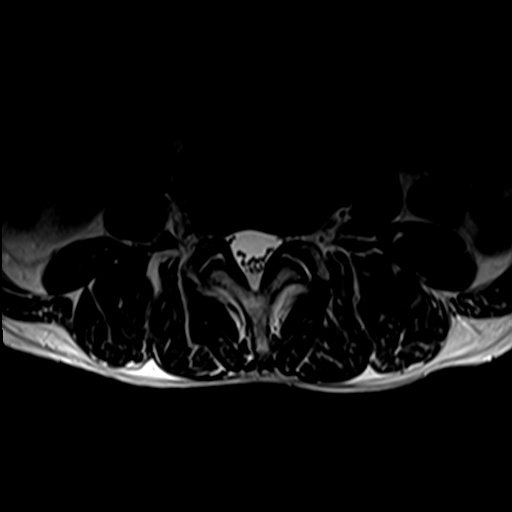
[im 21/41]
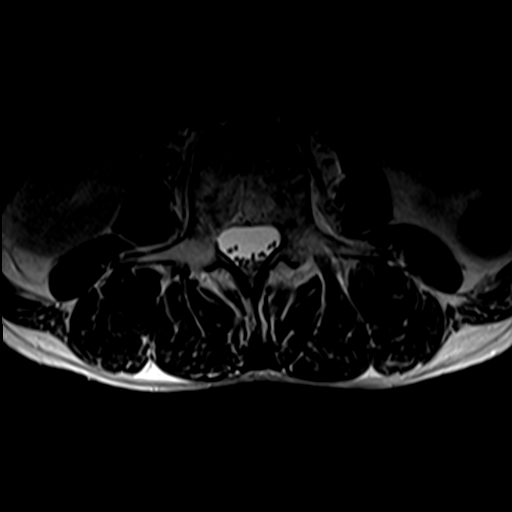
[im 23/41]
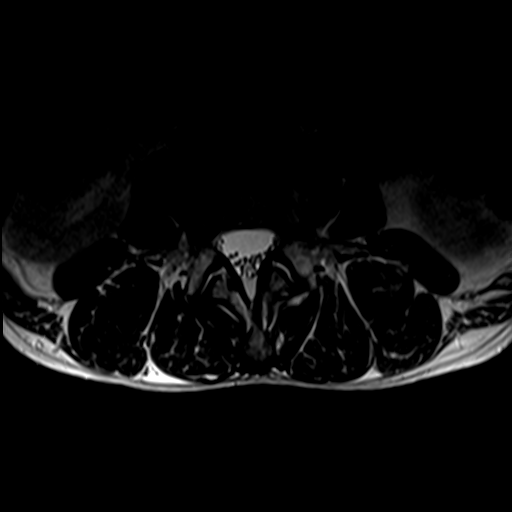
[im 29/41]
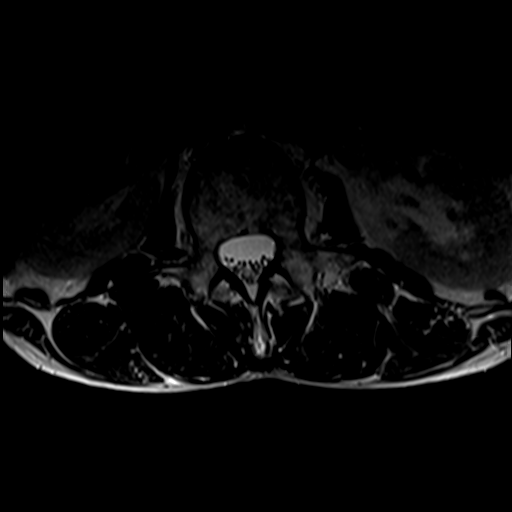
[im 35/41]
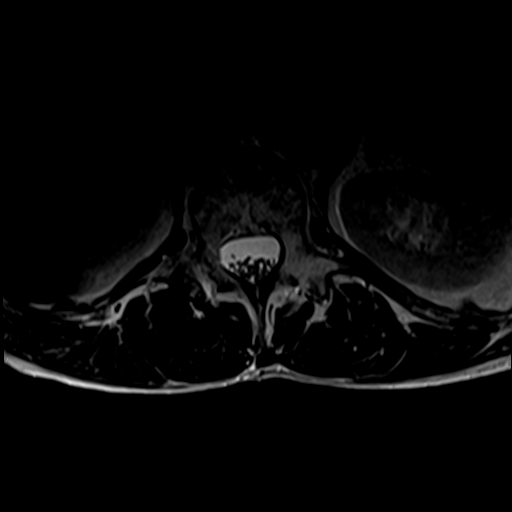
[im 41/41]
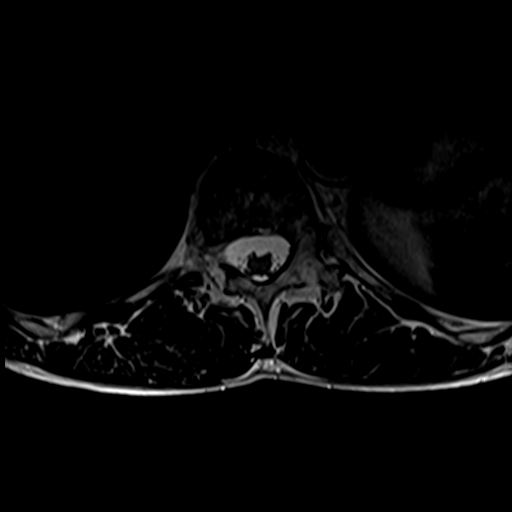

[Series 6: T1 · axial · 4.0mm · 0.39mm/px · z∈[-141,+40]mm · 6 of 41 slices shown (2 of 2)]
[im 1/41]
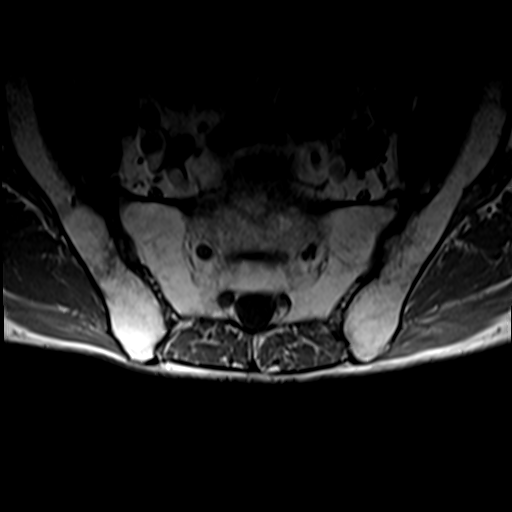
[im 6/41]
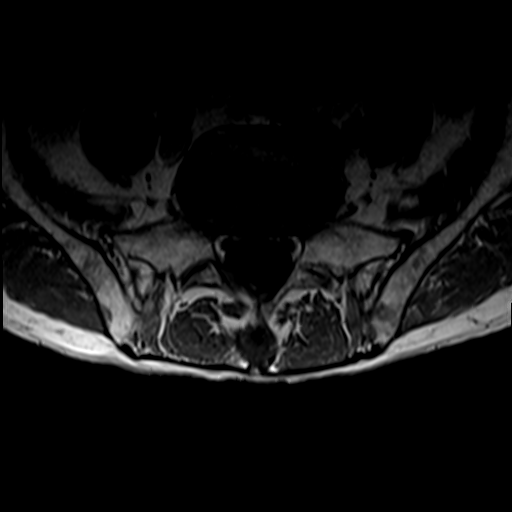
[im 12/41]
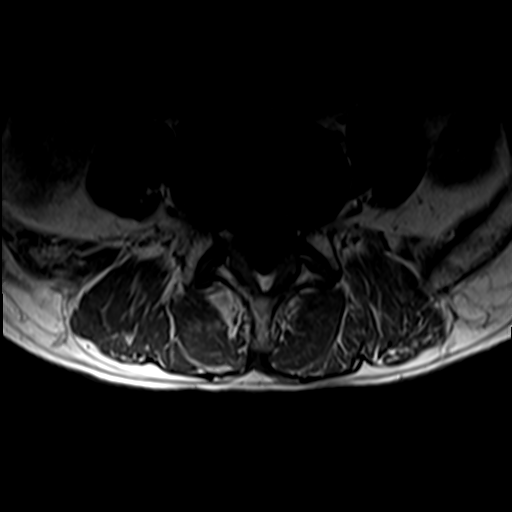
[im 18/41]
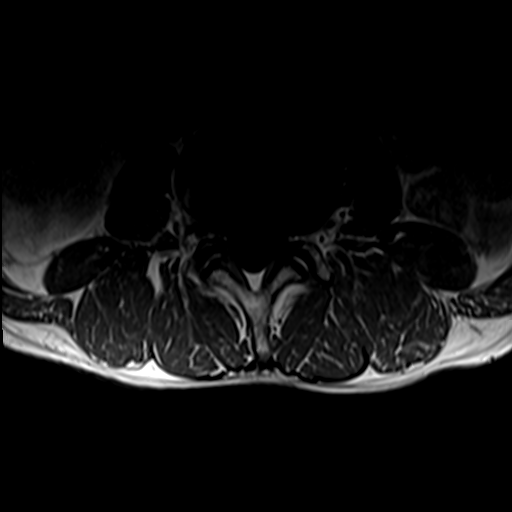
[im 21/41]
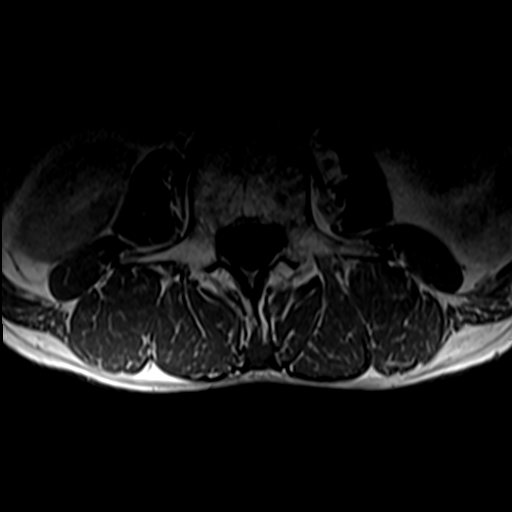
[im 35/41]
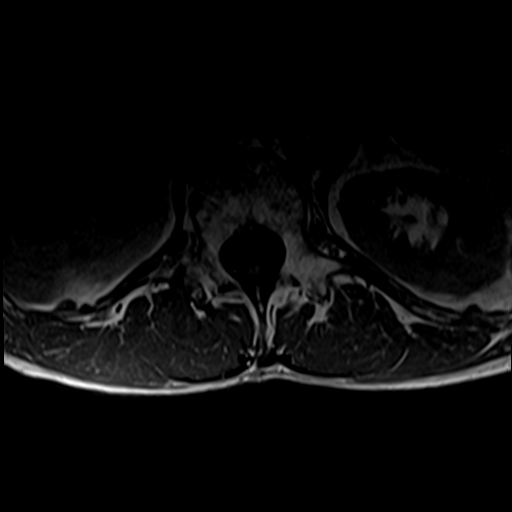

[27 of 48 positions shown; findings below may reference images not displayed]

FINDINGS: Segmentation:  Standard

Alignment:  Mixed grade 1 retrolisthesis at L2-3 and L3-4.

Vertebrae:  No fracture, evidence of discitis, or bone lesion.

Conus medullaris and cauda equina: Conus extends to the L1 level.
Conus and cauda equina appear normal.

Paraspinal and other soft tissues: Negative

Disc levels:

L1-L2: Normal disc space and facet joints. No spinal canal stenosis.
No neural foraminal stenosis.

L2-L3: Left asymmetric disc bulge with endplate spurring. No spinal
canal stenosis. Mild left neural foraminal stenosis.

L3-L4: Intermediate sized disc bulge. No spinal canal stenosis. Mild
left neural foraminal stenosis.

L4-L5: Intermediate sized disc bulge with endplate spurring. No
spinal canal stenosis. Moderate right and mild left neural foraminal
stenosis.

L5-S1: Normal disc space and facet joints. No spinal canal stenosis.
No neural foraminal stenosis.

Visualized sacrum: Normal.
IMPRESSION: 1. Moderate right L4-5 neural foraminal stenosis due to combination
of disc bulge and endplate spurring.
2. Mild left neural foraminal stenosis at L2-3, L3-4 and L4-5.
3. No spinal canal stenosis.

## 2021-06-26 ENCOUNTER — Ambulatory Visit (HOSPITAL_COMMUNITY)
Admission: RE | Admit: 2021-06-26 | Discharge: 2021-06-26 | Disposition: A | Discharge status: Home / Self Care | Payer: Medicare HMO | Source: Ambulatory Visit | Attending: Gastroenterology | Admitting: Gastroenterology

## 2021-06-26 DIAGNOSIS — K7689 Other specified diseases of liver: Secondary | ICD-10-CM | POA: Diagnosis not present

## 2021-06-26 DIAGNOSIS — K649 Unspecified hemorrhoids: Secondary | ICD-10-CM | POA: Insufficient documentation

## 2021-06-26 DIAGNOSIS — R001 Bradycardia, unspecified: Secondary | ICD-10-CM | POA: Diagnosis not present

## 2021-06-26 IMAGING — US US ABDOMEN LIMITED
1 series · 14 of 25 positions shown · non-contrast
Comparison: CT [DATE].

CLINICAL DATA: Rectal varices, assess for cirrhosis.

EXAM:
ULTRASOUND ABDOMEN LIMITED RIGHT UPPER QUADRANT

[Series 1: us abdomen limited · 14 of 56 slices shown]
[im 1/56]
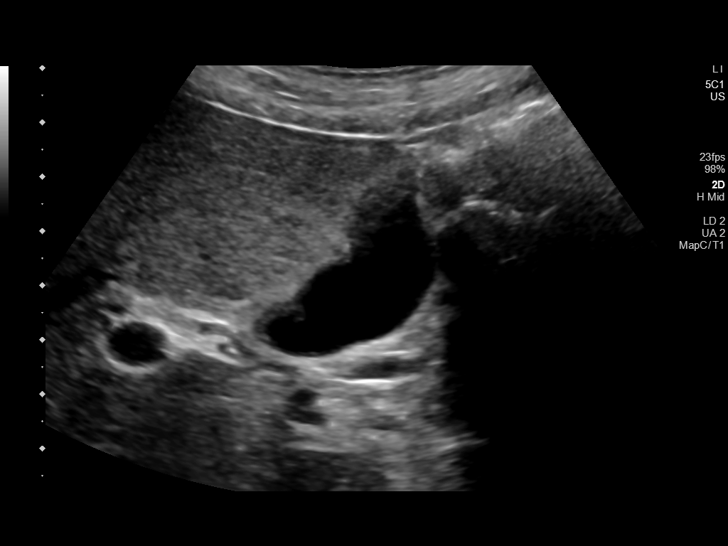
[im 5/56]
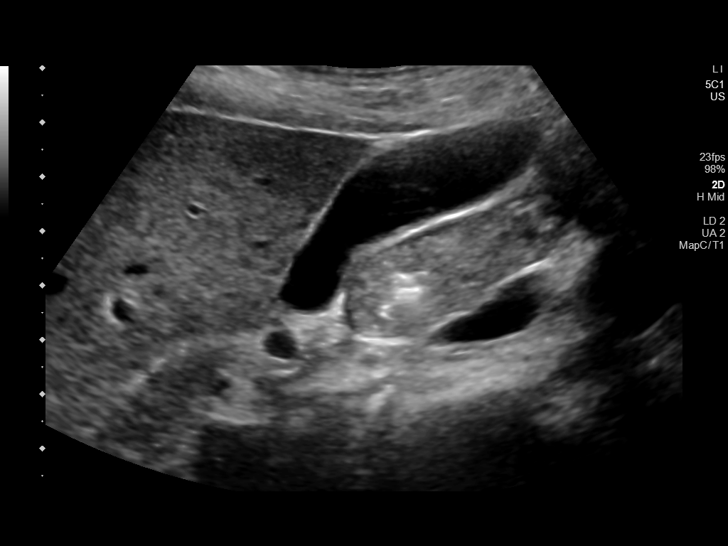
[im 10/56]
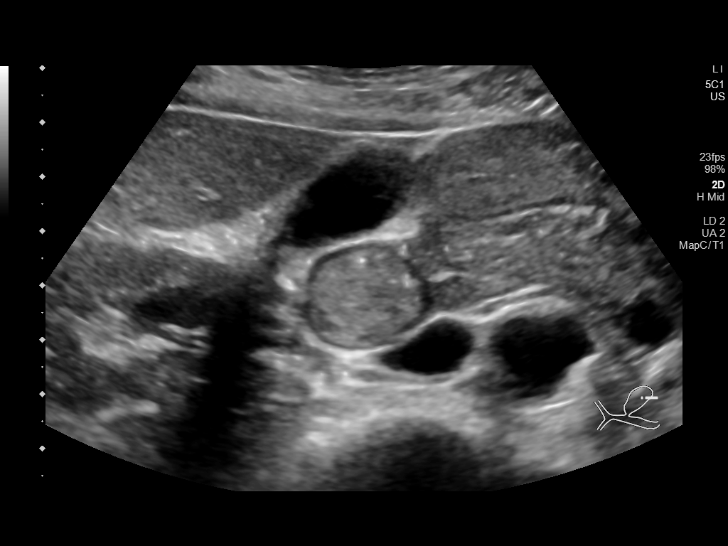
[im 14/56]
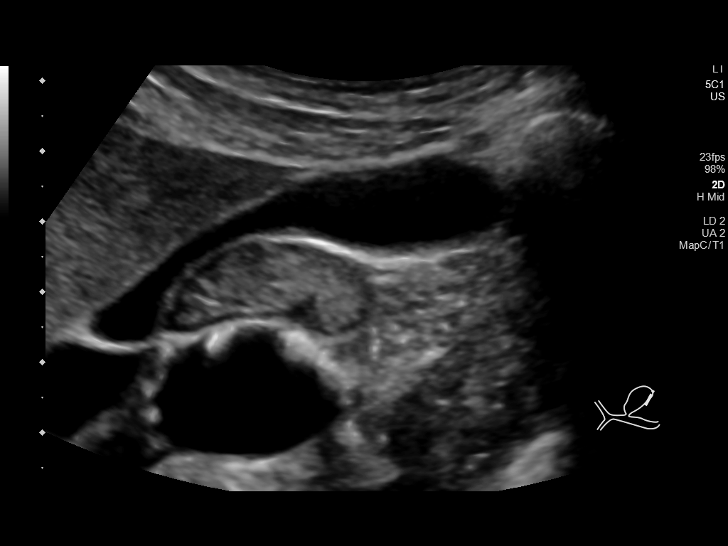
[im 19/56]
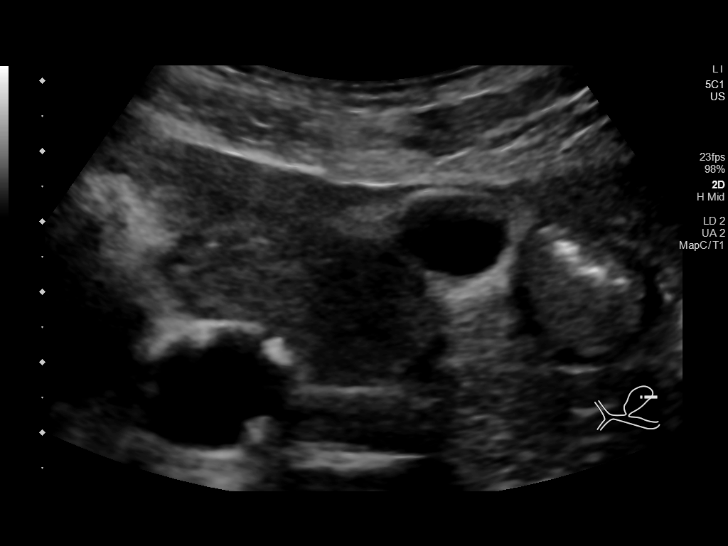
[im 21/56]
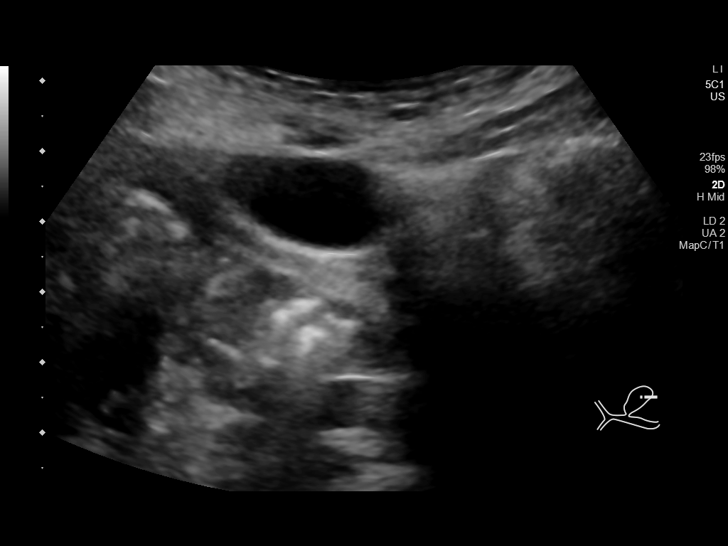
[im 26/56]
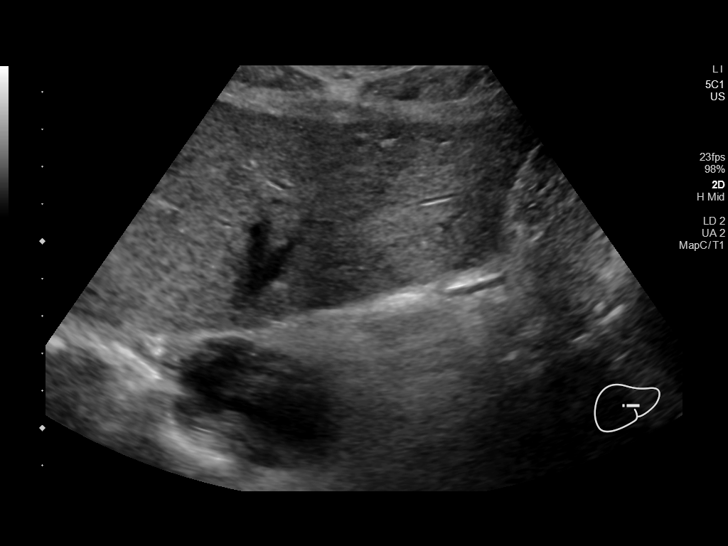
[im 30/56]
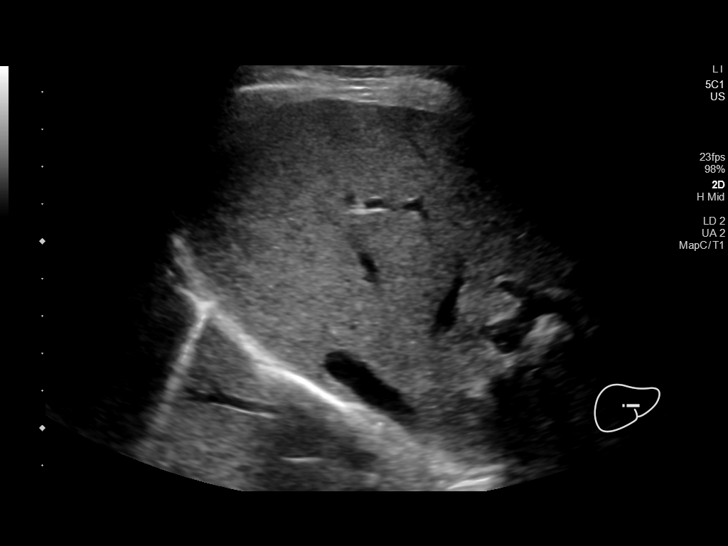
[im 35/56]
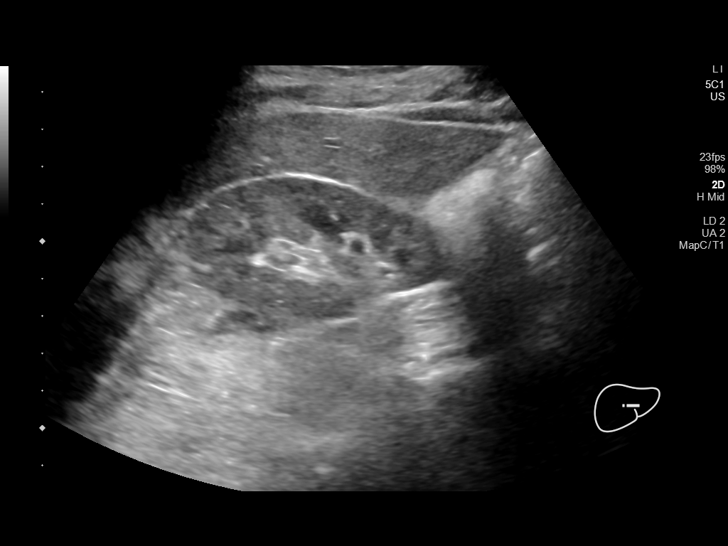
[im 37/56]
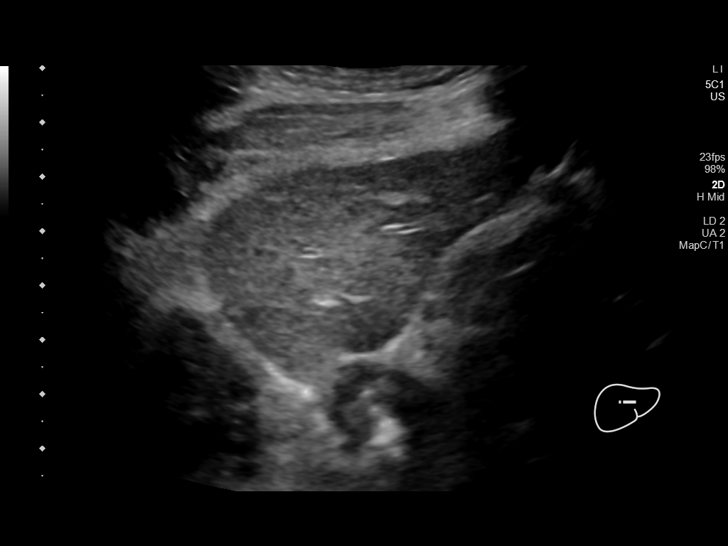
[im 42/56]
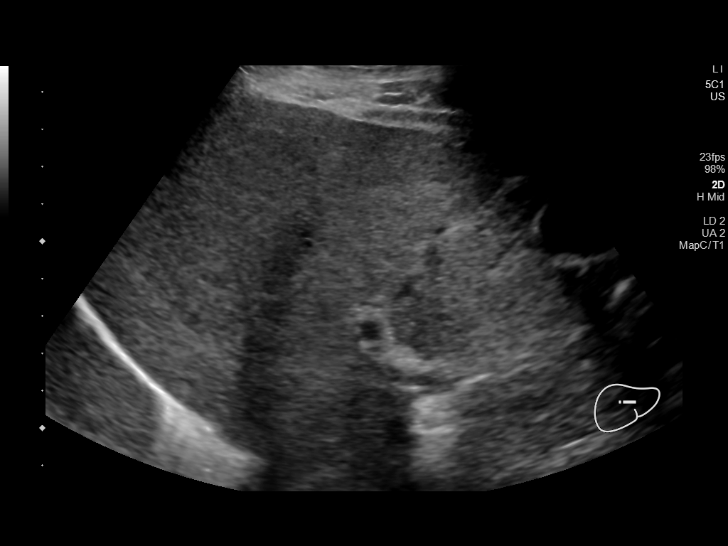
[im 46/56]
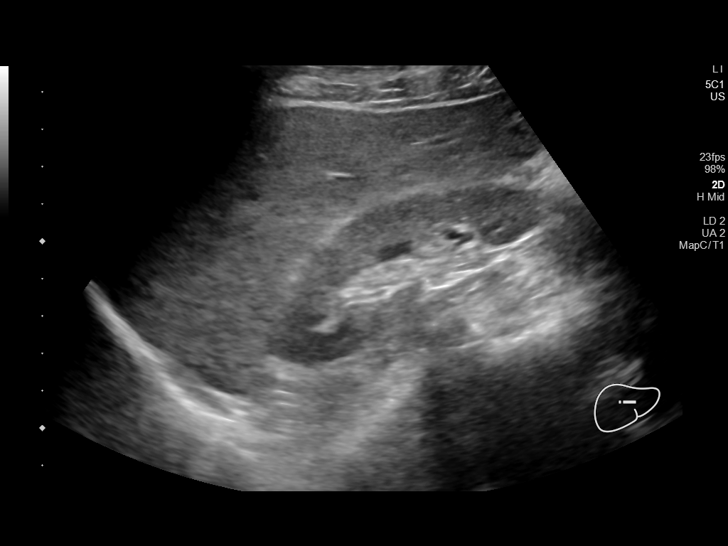
[im 51/56]
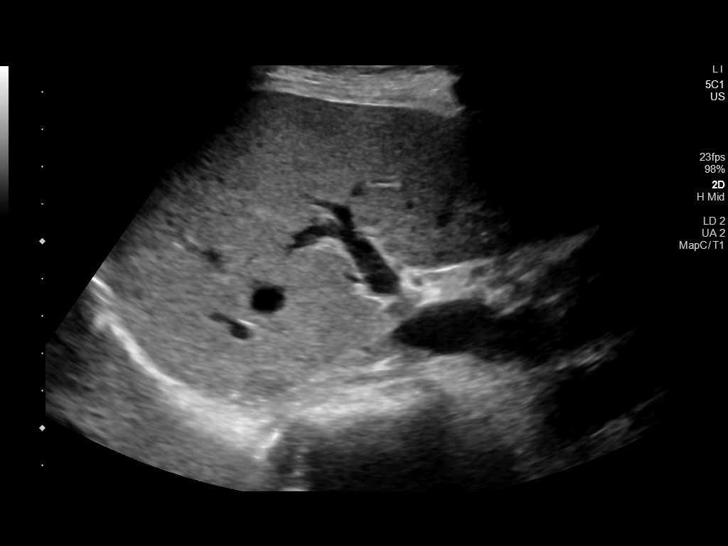
[im 56/56]
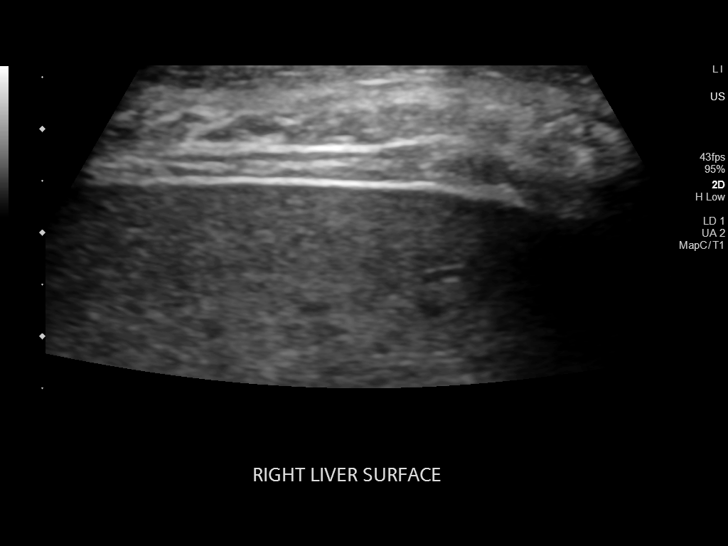

[14 of 25 positions shown; findings below may reference images not displayed]

FINDINGS: Gallbladder:

No gallstones or wall thickening visualized. No sonographic Murphy
sign noted by sonographer.

Common bile duct:

Diameter: 6 mm

Liver:

No focal lesion identified. Coarsened echotexture with increased
parenchymal echogenicity. No discrete contour nodularity. Portal
vein is patent on color Doppler imaging with normal direction of
blood flow towards the liver.

Other: None.
IMPRESSION: No focal liver lesion identified. Coarsened hepatic echotexture with
increased parenchymal echogenicity but without discrete contour
nodularity, nonspecific finding which may reflect hepatic steatosis
or hepatocellular disease. Consider further evaluation for hepatic
cirrhosis by ultrasound with elastography.

## 2021-06-28 ENCOUNTER — Other Ambulatory Visit: Payer: Medicare HMO

## 2021-06-30 NOTE — Progress Notes (Signed)
I have called the patient with the results of his low dose CT. I explained that his scan was read as a Lung RADS 4 A : suspicious findings, either short term follow up in 3 months or alternatively  PET Scan evaluation may be considered when there is a solid component of  8 mm or larger. I explained that the radiologist feels this may be scar, but that we need to re-evaluate in 3 months. He is in agreement with this plan.  Langley Gauss, please fax results to PCP and fax results to PCP, and schedule a 3 month follow up Low Dose CT Chest.

## 2021-07-03 ENCOUNTER — Other Ambulatory Visit: Payer: Self-pay | Admitting: Urology

## 2021-07-03 ENCOUNTER — Other Ambulatory Visit: Payer: Self-pay | Admitting: *Deleted

## 2021-07-03 DIAGNOSIS — Z87891 Personal history of nicotine dependence: Secondary | ICD-10-CM

## 2021-07-03 DIAGNOSIS — F17210 Nicotine dependence, cigarettes, uncomplicated: Secondary | ICD-10-CM

## 2021-07-03 DIAGNOSIS — R911 Solitary pulmonary nodule: Secondary | ICD-10-CM

## 2021-07-03 DIAGNOSIS — R972 Elevated prostate specific antigen [PSA]: Secondary | ICD-10-CM

## 2021-07-06 ENCOUNTER — Telehealth (HOSPITAL_COMMUNITY): Payer: Self-pay | Admitting: *Deleted

## 2021-07-06 NOTE — Telephone Encounter (Signed)
Close encounter 

## 2021-07-07 ENCOUNTER — Ambulatory Visit (HOSPITAL_BASED_OUTPATIENT_CLINIC_OR_DEPARTMENT_OTHER)
Admission: RE | Admit: 2021-07-07 | Discharge: 2021-07-07 | Disposition: A | Discharge status: Home / Self Care | Payer: Medicare HMO | Source: Ambulatory Visit | Attending: Cardiology | Admitting: Cardiology

## 2021-07-07 ENCOUNTER — Ambulatory Visit (HOSPITAL_COMMUNITY)
Admission: RE | Admit: 2021-07-07 | Discharge: 2021-07-07 | Disposition: A | Discharge status: Home / Self Care | Payer: Medicare HMO | Source: Ambulatory Visit | Attending: Cardiovascular Disease | Admitting: Cardiovascular Disease

## 2021-07-07 ENCOUNTER — Other Ambulatory Visit: Payer: Self-pay

## 2021-07-07 DIAGNOSIS — R0602 Shortness of breath: Secondary | ICD-10-CM

## 2021-07-07 DIAGNOSIS — R0989 Other specified symptoms and signs involving the circulatory and respiratory systems: Secondary | ICD-10-CM | POA: Diagnosis not present

## 2021-07-07 LAB — EXERCISE TOLERANCE TEST
Angina Index: 0
Duke Treadmill Score: 4
Estimated workload: 4.6
Exercise duration (min): 3 min
Exercise duration (sec): 31 s
MPHR: 150 {beats}/min
Peak HR: 133 {beats}/min
Percent HR: 88 %
Rest HR: 66 {beats}/min
ST Depression (mm): 0 mm

## 2021-07-08 ENCOUNTER — Ambulatory Visit
Admission: RE | Admit: 2021-07-08 | Discharge: 2021-07-08 | Disposition: A | Discharge status: Home / Self Care | Payer: Medicare HMO | Source: Ambulatory Visit | Attending: Neurology | Admitting: Neurology

## 2021-07-08 DIAGNOSIS — M4802 Spinal stenosis, cervical region: Secondary | ICD-10-CM | POA: Diagnosis not present

## 2021-07-08 DIAGNOSIS — G959 Disease of spinal cord, unspecified: Secondary | ICD-10-CM

## 2021-07-08 DIAGNOSIS — M542 Cervicalgia: Secondary | ICD-10-CM | POA: Diagnosis not present

## 2021-07-08 IMAGING — MR MR CERVICAL SPINE W/O CM
5 series · 35 of 48 positions shown · non-contrast
Comparison: None.

CLINICAL DATA: 70-year-old male with bilateral neck pain radiating
to the shoulders. Weakness when turning neck. Numbness in the
bilateral extremities for 6-7 months.

EXAM:
MRI CERVICAL SPINE WITHOUT CONTRAST
TECHNIQUE: Multiplanar, multisequence MR imaging of the cervical spine was
performed. No intravenous contrast was administered.

[Series 2: T2 · sagittal · 3.0mm · 0.43mm/px · 8 of 17 slices shown (1 of 2)]
[im 1/17]
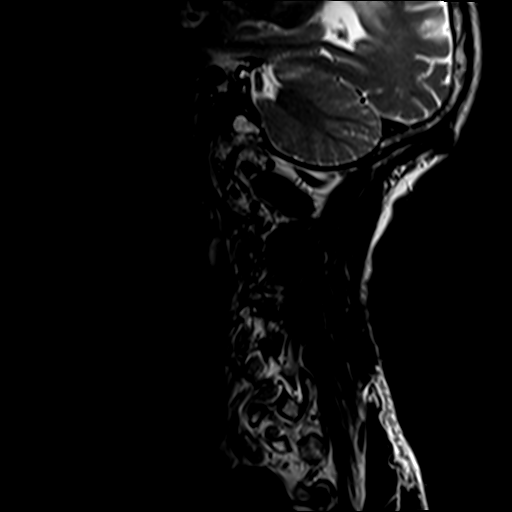
[im 3/17]
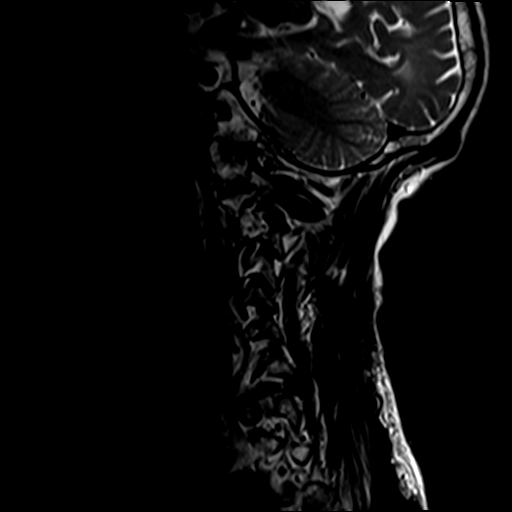
[im 5/17]
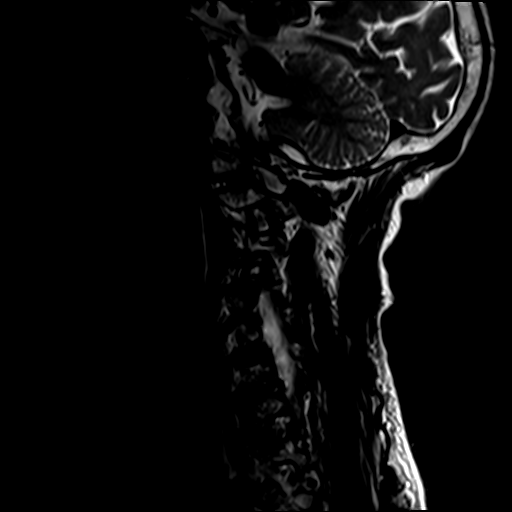
[im 7/17]
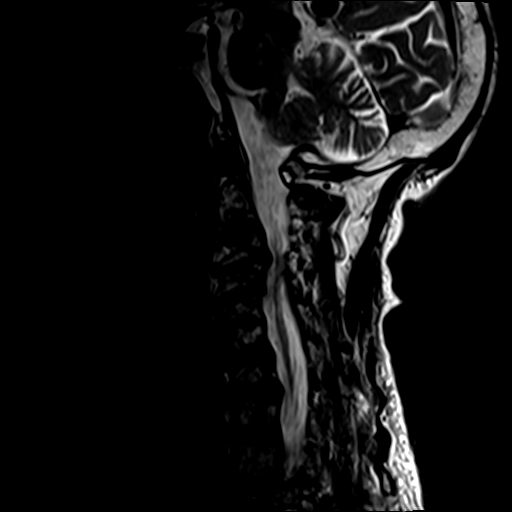
[im 10/17]
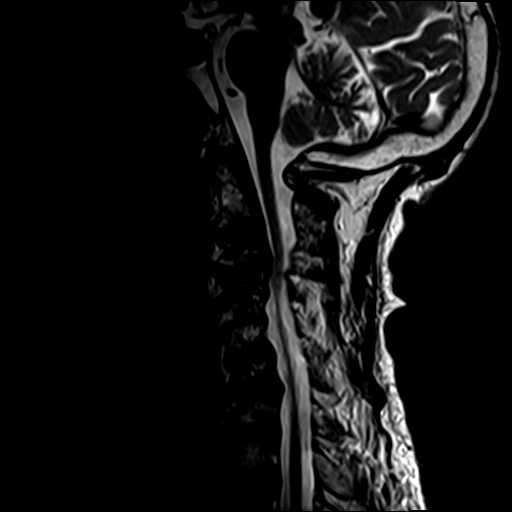
[im 12/17]
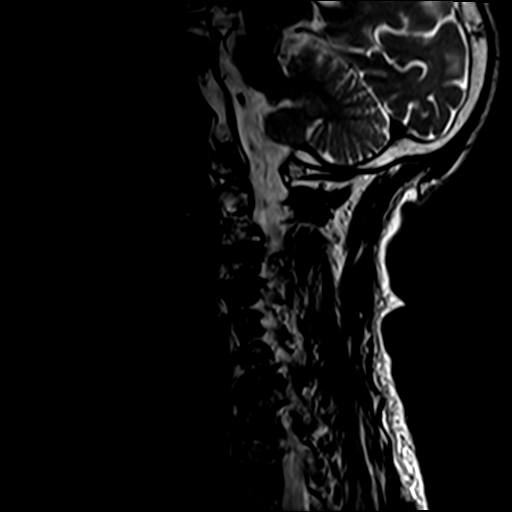
[im 14/17]
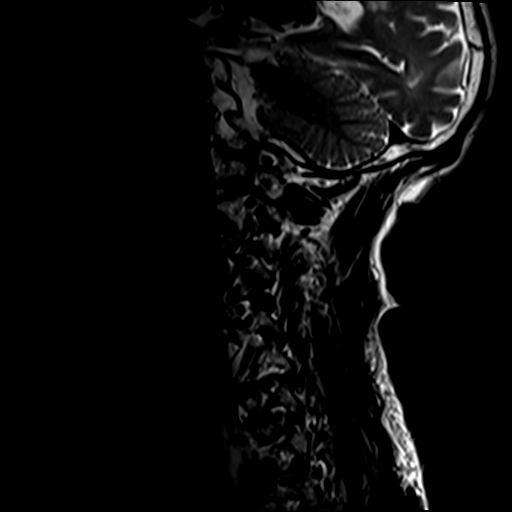
[im 17/17]
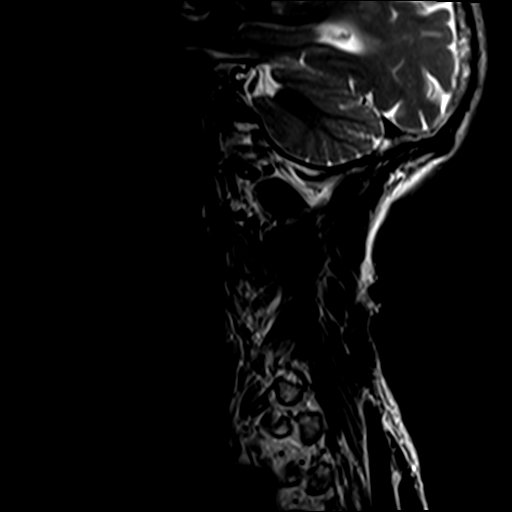

[Series 3: STIR · sagittal · 3.0mm · 0.86mm/px · 8 of 17 slices shown]
[im 1/17]
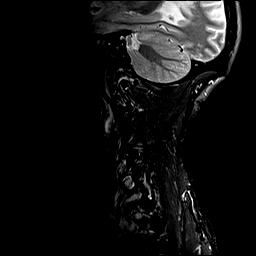
[im 3/17]
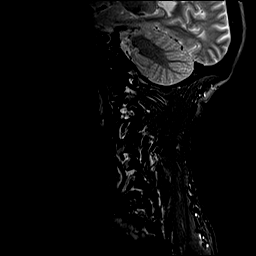
[im 5/17]
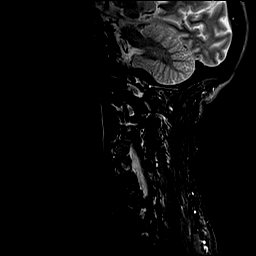
[im 7/17]
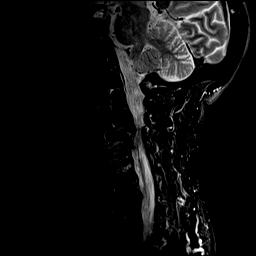
[im 10/17]
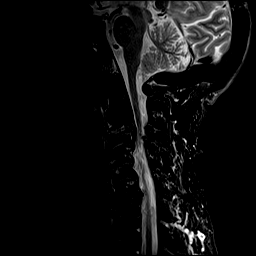
[im 12/17]
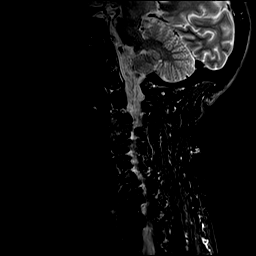
[im 14/17]
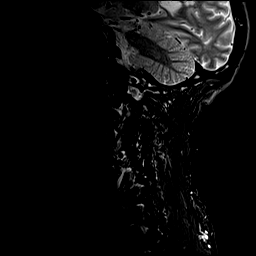
[im 17/17]
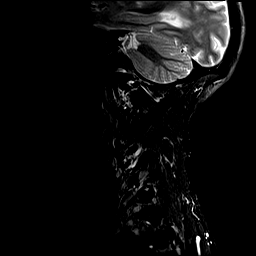

[Series 4: T1 · sagittal · 3.0mm · 0.86mm/px · 8 of 17 slices shown]
[im 1/17]
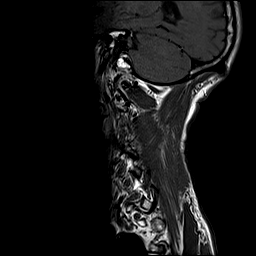
[im 3/17]
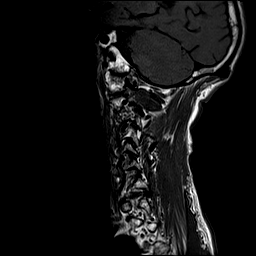
[im 5/17]
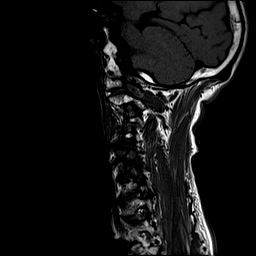
[im 7/17]
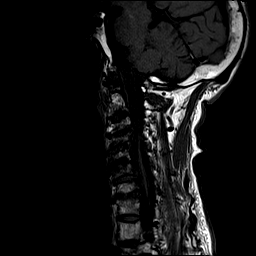
[im 10/17]
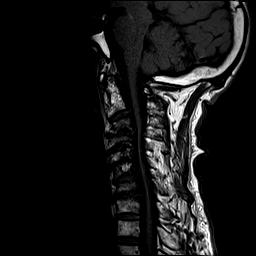
[im 12/17]
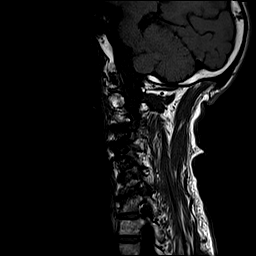
[im 14/17]
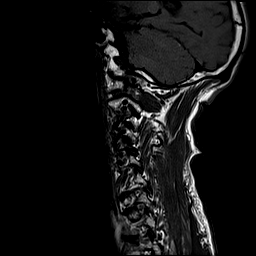
[im 17/17]
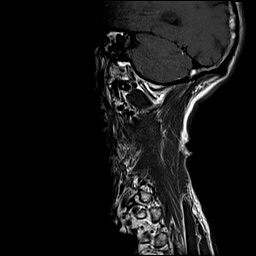

[Series 5: T2 · axial · 3.0mm · 0.70mm/px · z∈[-74,+13]mm · 9 of 25 slices shown (2 of 2)]
[im 1/25]
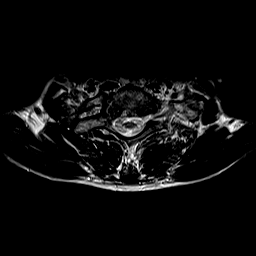
[im 5/25]
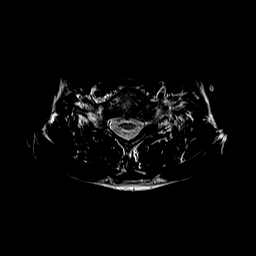
[im 7/25]
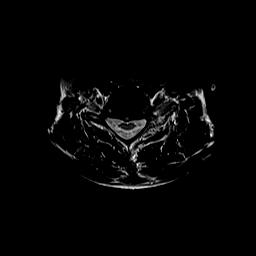
[im 11/25]
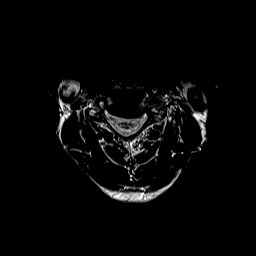
[im 14/25]
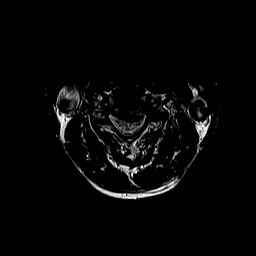
[im 18/25]
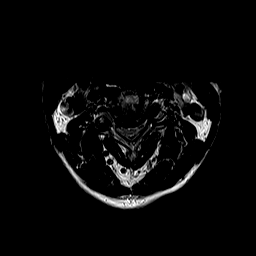
[im 20/25]
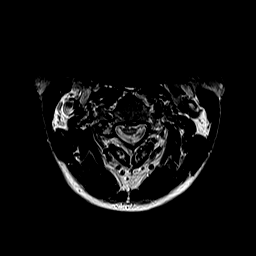
[im 22/25]
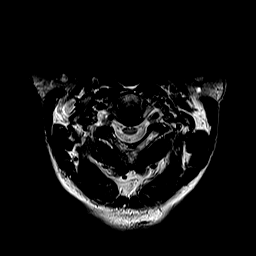
[im 25/25]
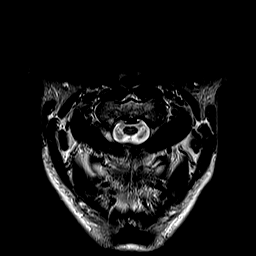

[Series 6: GRE · axial · 3.0mm · 0.35mm/px · z∈[-74,-59]mm · 2 of 25 slices shown]
[im 1/25]
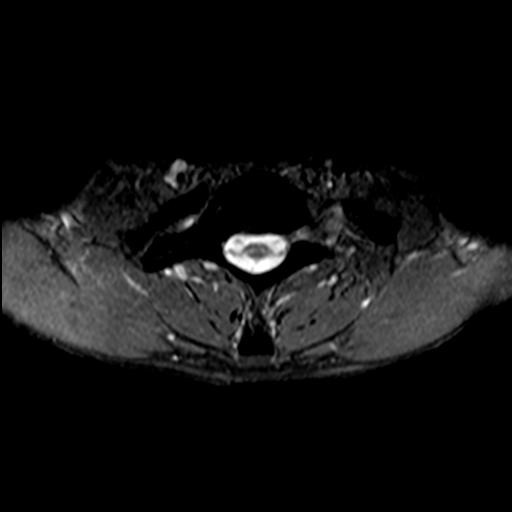
[im 5/25]
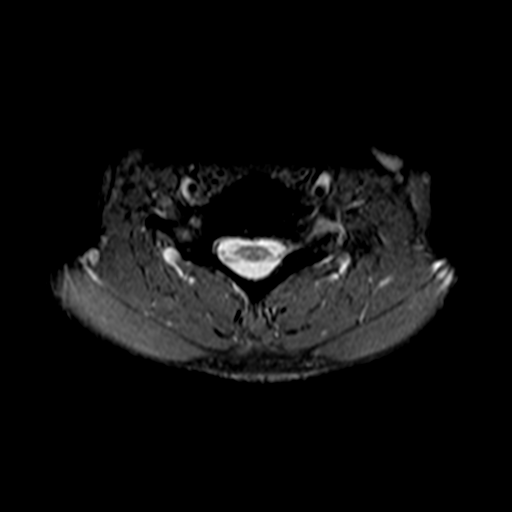

[35 of 48 positions shown; findings below may reference images not displayed]

FINDINGS: Alignment: Straightening and mild reversal of cervical lordosis.
Subtle anterolisthesis of C6 on C7, similar retrolisthesis of C3 on
C4.

Vertebrae: Heterogeneous marrow signal throughout the cervical
vertebral bodies appears to be degenerative in nature, with patchy
marrow edema from the inferior endplate of C3 through the superior
endplate of C7. background bone marrow signal appears to be normal.

Cord: Abnormal. Moderate to severe spinal cord volume loss and
patchy myelomalacia from C3-C4 (series 5, image 9) through C5-C6
(series 2, image 9). Above and below those levels the spinal cord
volume appears reduced, but without discrete cord signal
abnormality.

Posterior Fossa, vertebral arteries, paraspinal tissues:
Cervicomedullary junction is within normal limits. Negative visible
posterior fossa. Preserved major vascular flow voids in the neck.
Tortuous cervical vertebral arteries. Negative visible neck soft
tissues, lung apices.

Disc levels:

C2-C3:  Mild facet hypertrophy.  No stenosis.

C3-C4: Severe disc space loss. Circumferential disc osteophyte
complex with broad-based posterior component. Mild spinal stenosis,
although would be more pronounced if spinal cord volume were normal
here). Moderate to severe left and mild to moderate right C4
foraminal stenosis.

C4-C5: Severe disc space loss. Left eccentric circumferential disc
osteophyte complex. Broad-based posterior component. No significant
spinal stenosis, although might be mild of cord volume were normal.
Severe left and moderate to severe right C5 foraminal stenosis.

C5-C6: Severe disc space loss. Circumferential disc osteophyte
complex. Mild facet hypertrophy. No spinal stenosis. Mild left C6
foraminal stenosis.

C6-C7: Better preserved disc space. Circumferential disc bulge.
Small central posterior disc protrusion with annular fissure (series
6, image 19). No spinal stenosis. No foraminal stenosis.

C7-T1: Severe disc space loss. Circumferential disc osteophyte
complex. Mild facet hypertrophy. No spinal stenosis. But there is
moderate to severe left and moderate right C8 foraminal stenosis.

Upper thoracic facet hypertrophy but no upper thoracic spinal
stenosis.
IMPRESSION: 1. Extensive spinal cord volume loss and moderate to severe cord
Myelomalacia from C3-C4 through C5-C6.

2. Superimposed widespread advanced cervical disc and endplate
degeneration. Mild degenerative endplate marrow edema at multiple
levels.

3. Mild spinal stenosis at C3-C4, although would probably be
moderate if the cord volume were normal. Likewise, no significant
spinal stenosis elsewhere. There is moderate or severe neural
foraminal stenosis at the left C4, bilateral C5, and bilateral C8
nerve levels.

## 2021-07-10 ENCOUNTER — Telehealth: Payer: Self-pay

## 2021-07-10 ENCOUNTER — Telehealth: Payer: Self-pay | Admitting: Internal Medicine

## 2021-07-10 DIAGNOSIS — R001 Bradycardia, unspecified: Secondary | ICD-10-CM | POA: Diagnosis not present

## 2021-07-10 DIAGNOSIS — R261 Paralytic gait: Secondary | ICD-10-CM

## 2021-07-10 DIAGNOSIS — R937 Abnormal findings on diagnostic imaging of other parts of musculoskeletal system: Secondary | ICD-10-CM

## 2021-07-10 DIAGNOSIS — R292 Abnormal reflex: Secondary | ICD-10-CM

## 2021-07-10 DIAGNOSIS — G959 Disease of spinal cord, unspecified: Secondary | ICD-10-CM

## 2021-07-10 NOTE — Telephone Encounter (Signed)
Patient says he keeps getting refills of Gabapentin 300mg  from [facility].. patient says he is no longer on medication  Requesting we send something to [facility] to let them know to stop sending this rx through mail delivery  Jane Lew, [place]  Phone:  [phone] Fax:  [phone]

## 2021-07-11 NOTE — Telephone Encounter (Signed)
Spoke with pharmacy and they was able to confirm that they D/C'd the medication.

## 2021-07-12 DIAGNOSIS — G959 Disease of spinal cord, unspecified: Secondary | ICD-10-CM | POA: Diagnosis not present

## 2021-07-12 DIAGNOSIS — I10 Essential (primary) hypertension: Secondary | ICD-10-CM | POA: Diagnosis not present

## 2021-07-14 NOTE — Progress Notes (Signed)
Keith [name], can you let Keith [name] know that his ultrasound showed some findings of chronic liver disease, but did not demonstrate  obvious cirrhosis.  His liver enzymes were normal.  I suspect that if the rectal varices seen on his colonoscopy were secondary to cirrhosis and portal hypertension, we would have seen a very cirrhotic appearing liver on the [place].   However, to be thorough, I recommend we get the elastography study to more definitively rule out cirrhosis.  Can you please order that for him?

## 2021-07-25 ENCOUNTER — Telehealth: Payer: Self-pay

## 2021-07-25 ENCOUNTER — Other Ambulatory Visit: Payer: Self-pay

## 2021-07-25 DIAGNOSIS — K769 Liver disease, unspecified: Secondary | ICD-10-CM

## 2021-07-25 DIAGNOSIS — K649 Unspecified hemorrhoids: Secondary | ICD-10-CM

## 2021-07-25 NOTE — Telephone Encounter (Signed)
-----   Message from April H Pait sent at 07/25/2021 11:59 AM EST ----- Regarding: FW: [place] with elastography  ----- Message ----- From: Cathlean Cower Sent: 07/25/2021  11:28 AM EST To: April H Pait Subject: RE: [place] with elastography                       Patient declined to schedule at this time.     ad ----- Message ----- From: Belva Chimes H Sent: 07/25/2021  10:39 AM EST To: Cathlean Cower Subject: FW: [place] with elastography                        ----- Message ----- From: Algernon Huxley, RN Sent: 07/25/2021  10:30 AM EST To: April H Pait, Roosvelt Maser Subject: [place] with elastography                           Please schedule pt for [place] of liver with elastography, order in epic.

## 2021-07-25 NOTE — Telephone Encounter (Signed)
Dr. Candis Schatz the pt declined to scheduled the [place] with elastography at this time.

## 2021-07-28 ENCOUNTER — Telehealth: Payer: Self-pay

## 2021-07-28 ENCOUNTER — Telehealth: Payer: Self-pay | Admitting: Internal Medicine

## 2021-07-28 NOTE — Progress Notes (Deleted)
Chronic Care Management Pharmacy Note  07/28/2021 Name:  Keith [name] MRN:  [id number] DOB:  Feb 09, 1951  Summary:  - Patient reports that he has been adherent with newly prescribed medications at last PCP visit, notes that swelling in legs has improved since starting indapamide, has not completely resolved, denies any issues with pain from the swelling  - Reports that he is unsure of improvement in BP control since starting indapamide as his blood pressure cuff is not working at this time  -Notes that neuropathy has improved with gabapentin, still can have mild tingling in hands and feet but overall well controlled  Recommendations/Changes made from today's visit: - Recommending for patient to purchase new blood pressure cuff to be able to check blood pressure to measure progress in blood pressure control, patient to reach out should by average >140/90 -Patient continue current medications, advised for patient to reduce sodium intake to less than 2361m in a day to help with BP control  Subjective: Keith Upchurchis an 70y.o. year old male who is a primary patient of JJanith Lima MD.  The CCM team was consulted for assistance with disease management and care coordination needs.    Engaged with patient by telephone for initial visit in response to provider referral for pharmacy case management and/or care coordination services.   Consent to Services:  The patient was given the following information about Chronic Care Management services today, agreed to services, and gave verbal consent: 1. CCM service includes personalized support from designated clinical staff supervised by the primary care provider, including individualized plan of care and coordination with other care providers 2. 24/7 contact phone numbers for assistance for urgent and routine care needs. 3. Service will only be billed when office clinical staff spend 20 minutes or more in a month to coordinate care. 4. Only  one practitioner may furnish and bill the service in a calendar month. 5.The patient may stop CCM services at any time (effective at the end of the month) by phone call to the office staff. 6. The patient will be responsible for cost sharing (co-pay) of up to 20% of the service fee (after annual deductible is met). Patient agreed to services and consent obtained.  Patient Care Team: JJanith Lima MD as PCP - General (Internal Medicine) PAlda Berthold DO as Consulting Physician (Neurology)  Recent office visits:  06/07/2021 - Dr. JRonnald Ramp-f/u for HTN and HLD - gabapentin and indapamide discontinued - f/u in 3 months    Recent consult visits:  07/12/2021 - [facility] Referred for evaluation of neck pain / weakness  06/23/2021 - Dr. PPosey Pronto- Neurology - evaluation of bilateral leg weakness and gait instability - MRI ordered   06/22/2021 - Dr. HPercival Spanish- Cardiology - given zio monitor - no medication changes  06/01/2021 - Dr. MWarner Mccreedy- Pulmonology - no changes to medications - smoking cessation encouraged   Hospital visits:  None in previous 6 months  Objective:  Lab Results  Component Value Date   CREATININE 1.09 06/07/2021   BUN 17 06/07/2021   GFR 68.94 06/07/2021   NA 139 06/07/2021   K 4.1 06/07/2021   CALCIUM 9.6 06/07/2021   CO2 31 06/07/2021   GLUCOSE 87 06/07/2021    Lab Results  Component Value Date/Time   GFR 68.94 06/07/2021 03:55 PM   GFR 80.39 04/06/2021 02:42 PM    Last diabetic Eye exam:  No results found for: HMDIABEYEEXA  Last diabetic Foot exam:  No results found for: HMDIABFOOTEX   Lab Results  Component Value Date   CHOL 112 04/06/2021   HDL 54.00 04/06/2021   LDLCALC 37 04/06/2021   TRIG 105.0 04/06/2021   CHOLHDL 2 04/06/2021    Hepatic Function Latest Ref Rng & Units 04/06/2021  Total Protein 6.0 - 8.3 g/dL 7.2  Albumin 3.5 - 5.2 g/dL 4.3  AST 0 - 37 U/L 20  ALT 0 - 53 U/L 9  Alk Phosphatase 39 - 117  U/L 81  Total Bilirubin 0.2 - 1.2 mg/dL 0.5  Bilirubin, Direct 0.0 - 0.3 mg/dL 0.1    Lab Results  Component Value Date/Time   TSH 2.57 04/06/2021 02:42 PM    CBC Latest Ref Rng & Units 06/07/2021 04/06/2021  WBC 4.0 - 10.5 K/uL 3.0(L) 3.1(L)  Hemoglobin 13.0 - 17.0 g/dL 11.0(L) 13.0  Hematocrit 39.0 - 52.0 % 33.3(L) 39.9  Platelets 150.0 - 400.0 K/uL 223.0 232.0    Lab Results  Component Value Date/Time   VD25OH 14.23 (L) 04/06/2021 02:42 PM    Clinical ASCVD: No  The ASCVD Risk score (Arnett DK, et al., 2019) failed to calculate for the following reasons:   The valid total cholesterol range is 130 to 320 mg/dL    Depression screen [facility] 2/9 04/06/2021  Decreased Interest 0  Down, Depressed, Hopeless 0  PHQ - 2 Score 0     Social History   Tobacco Use  Smoking Status Every Day   Packs/day: 0.50   Years: 40.00   Pack years: 20.00   Types: Cigarettes   Start date: 02/15/1969  Smokeless Tobacco Never   BP Readings from Last 3 Encounters:  06/23/21 103/70  06/22/21 110/68  06/13/21 130/75   Pulse Readings from Last 3 Encounters:  06/23/21 (!) 58  06/22/21 (!) 58  06/13/21 66   Wt Readings from Last 3 Encounters:  06/23/21 124 lb (56.2 kg)  06/22/21 125 lb 12.8 oz (57.1 kg)  06/13/21 119 lb 8 oz (54.2 kg)   BMI Readings from Last 3 Encounters:  06/23/21 20.32 kg/m  06/22/21 20.93 kg/m  06/13/21 19.89 kg/m    Assessment/Interventions: Review of patient past medical history, allergies, medications, health status, including review of consultants reports, laboratory and other test data, was performed as part of comprehensive evaluation and provision of chronic care management services.   SDOH:  (Social Determinants of Health) assessments and interventions performed: Yes  SDOH Screenings   Alcohol Screen: Not on file  Depression (PHQ2-9): Low Risk    PHQ-2 Score: 0  Financial Resource Strain: Low Risk    Difficulty of Paying Living Expenses: Not very hard   Food Insecurity: Not on file  Housing: Not on file  Physical Activity: Not on file  Social Connections: Not on file  Stress: Not on file  Tobacco Use: High Risk   Smoking Tobacco Use: Every Day   Smokeless Tobacco Use: Never   Passive Exposure: Not on file  Transportation Needs: Not on file    [place]  No Known Allergies  Medications Reviewed Today     Reviewed by Nira Conn, CMA (Certified Medical Assistant) on 06/23/21 at [phone]  Med List Status: <None>   Medication Order Taking? Sig Documenting Provider Last Dose Status Informant  Cholecalciferol 1.25 MG (50000 UT) capsule [id number] Yes Take 1 capsule (50,000 Units total) by mouth once a week. Janith Lima, MD Taking Active   doxycycline 100 mg in sodium chloride  0.9 % 250 mL [id number] Yes Inject 100 mg into the vein every 12 (twelve) hours. [provider] Taking Active   Magnesium Oxide 400 MG CAPS [id number] Yes Take 1 capsule (400 mg total) by mouth daily. Janith Lima, MD Taking Active   rosuvastatin (CRESTOR) 5 MG tablet [id number] Yes Take 1 tablet (5 mg total) by mouth daily. Janith Lima, MD Taking Active             Patient Active Problem List   Diagnosis Date Noted   Acute left ankle pain 06/07/2021   Need for immunization against influenza 06/07/2021   Hypomagnesemia syndrome 06/07/2021   Encounter for general adult medical examination with abnormal findings 04/08/2021   PSA elevation 04/07/2021   Vitamin D deficiency disease 04/07/2021   Mucopurulent chronic bronchitis ([place]) 04/06/2021   Primary hypertension 04/06/2021   Tobacco abuse 04/06/2021   Abnormal physical evaluation 04/06/2021   Screen for colon cancer 04/06/2021   Bradycardia, sinus 04/06/2021   Benign prostatic hyperplasia with urinary frequency 04/06/2021   Dyslipidemia, goal LDL below 100 04/06/2021   Neuropathy 04/06/2021   Claudication of both lower extremities ([place]) 04/06/2021   ALCOHOL ABUSE 08/25/2009    COPD 08/25/2009   GERD 08/25/2009   DYSPHAGIA 08/25/2009   WEIGHT LOSS 08/24/2009    Immunization History  Administered Date(s) Administered   Fluad Quad(high Dose 65+) 06/07/2021   PFIZER(Purple Top)SARS-COV-2 Vaccination 10/23/2019, 11/13/2019, 06/27/2020   PNEUMOCOCCAL CONJUGATE-20 04/06/2021    Conditions to be addressed/monitored:  Hypertension, Hyperlipidemia, vitamin D deficiency and neuropathy  There are no care plans that you recently modified to display for this patient.     Medication Assistance: None required.  Patient affirms current coverage meets needs.  Patient's preferred pharmacy is:  [facility] [place], [place] [address] Phone: [phone] Fax: [phone]  [id number] - [place], [place] - [address] [address] Phone: [phone] Fax: [phone]   Uses pill box? No - able to manage without  Pt endorses 100% compliance  Care Plan and Follow Up Patient Decision:  Patient agrees to Care Plan and Follow-up.  Plan: Telephone follow up appointment with care management team member scheduled for:  1 month and The patient has been provided with contact information for the care management team and has been advised to call with any health related questions or concerns.   [name], PharmD Clinical Pharmacist, [place]   Chart prep = 13 minutes

## 2021-07-28 NOTE — Telephone Encounter (Signed)
Pt. Called and states that he had a pharmacy call scheduled for today, but no one ever called. Requesting to have a return call to discuss what happened.    Callback #- [phone]

## 2021-07-29 ENCOUNTER — Ambulatory Visit
Admission: RE | Admit: 2021-07-29 | Discharge: 2021-07-29 | Disposition: A | Discharge status: Home / Self Care | Payer: Medicare HMO | Source: Ambulatory Visit | Attending: Urology | Admitting: Urology

## 2021-07-29 ENCOUNTER — Other Ambulatory Visit: Payer: Self-pay

## 2021-07-29 DIAGNOSIS — R972 Elevated prostate specific antigen [PSA]: Secondary | ICD-10-CM | POA: Diagnosis not present

## 2021-07-29 IMAGING — MR MR PROSTATE WO/W CM
12 series · 48 of 48 positions shown · IV contrast (multihance)
Comparison: None.

CLINICAL DATA: Elevated PSA level of 4.44 on [DATE]

EXAM:
MR PROSTATE WITHOUT AND WITH CONTRAST
TECHNIQUE: Multiplanar multisequence MRI images were obtained of the pelvis
centered about the prostate. Pre and post contrast images were
obtained.
CONTRAST:  11mL MULTIHANCE GADOBENATE DIMEGLUMINE 529 MG/ML IV SOLN

[Series 3: T2 · coronal · 3.0mm · 0.56mm/px · 1 of 23 slices shown (1 of 3)]
[im 1/23]
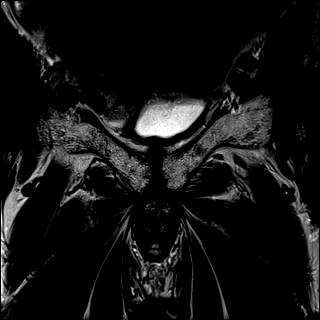

[Series 4: T1 · axial · 5.0mm · 1.25mm/px · z∈[-98,+117]mm · 2 of 88 slices shown]
[im 1/88]
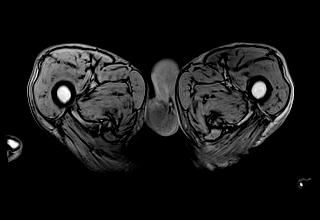
[im 88/88]
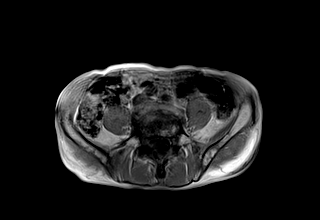

[Series 5: DWI · axial · 3.0mm · 1.75mm/px · 1 of 60 slices shown (1 of 3)]
[im 1/60]
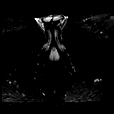

[Series 6: DWI · axial · 3.0mm · 1.75mm/px · 1 of 20 slices shown (2 of 3)]
[im 1/20]
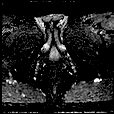

[Series 7: DWI · axial · 3.0mm · 1.75mm/px · 1 of 20 slices shown (3 of 3)]
[im 1/20]
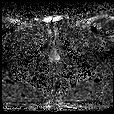

[Series 8: T2 · axial · 3.0mm · 0.56mm/px · 1 of 23 slices shown (2 of 3)]
[im 1/23]
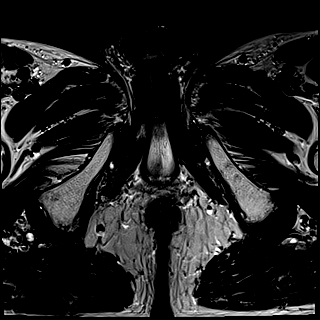

[Series 9: T2 · axial · 1.0mm · 1.04mm/px · z∈[-59,+12]mm · 2 of 72 slices shown (3 of 3)]
[im 1/72]
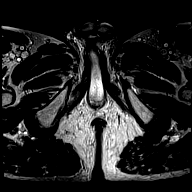
[im 72/72]
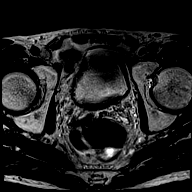

[Series 10: pre t1_twist_tra_dyn · axial · non-contrast · 3.5mm · 0.83mm/px · 1 of 20 slices shown]
[im 1/20]
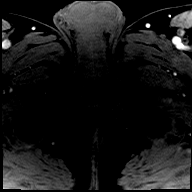

[Series 11: post t1_twist_tra_dyn-copy center · axial · non-contrast · 3.5mm · 0.83mm/px · z∈[-58,+9]mm · 17 of 600 slices shown]
[im 1/600]
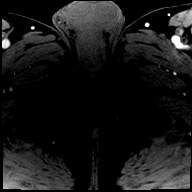
[im 38/600]
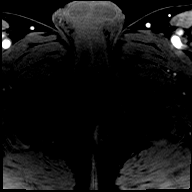
[im 75/600]
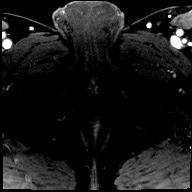
[im 113/600]
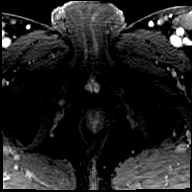
[im 150/600]
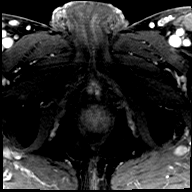
[im 188/600]
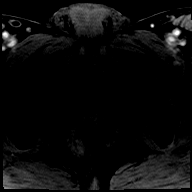
[im 225/600]
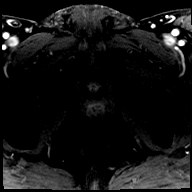
[im 263/600]
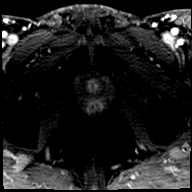
[im 300/600]
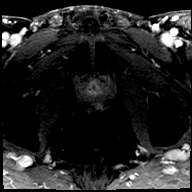
[im 337/600]
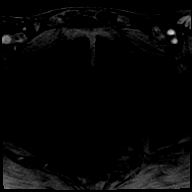
[im 375/600]
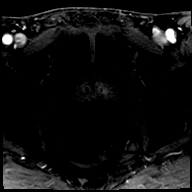
[im 412/600]
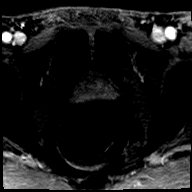
[im 450/600]
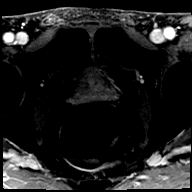
[im 487/600]
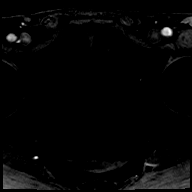
[im 525/600]
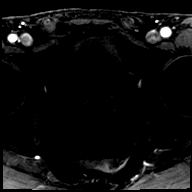
[im 562/600]
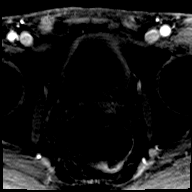
[im 600/600]
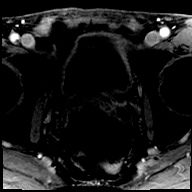

[Series 12: post t1_twist_tra_dyn-copy cent_sub · axial · 3.5mm · 0.83mm/px · z∈[-58,+9]mm · 17 of 580 slices shown]
[im 1/580]
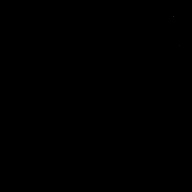
[im 37/580]
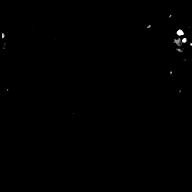
[im 73/580]
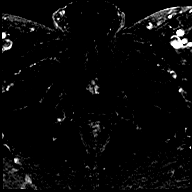
[im 109/580]
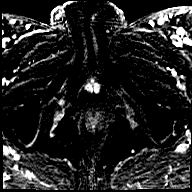
[im 145/580]
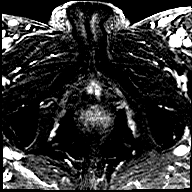
[im 181/580]
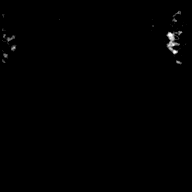
[im 218/580]
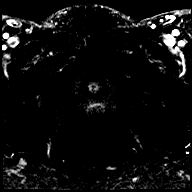
[im 254/580]
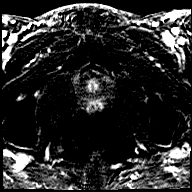
[im 290/580]
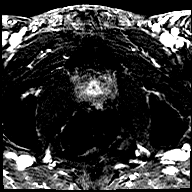
[im 326/580]
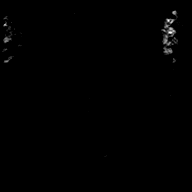
[im 362/580]
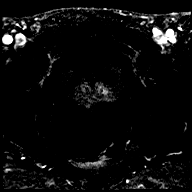
[im 399/580]
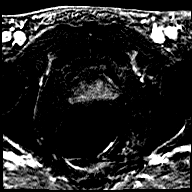
[im 435/580]
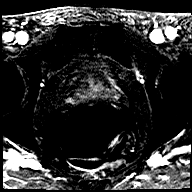
[im 471/580]
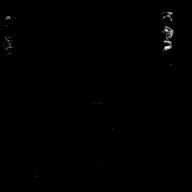
[im 507/580]
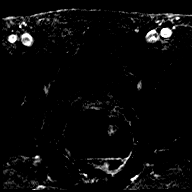
[im 543/580]
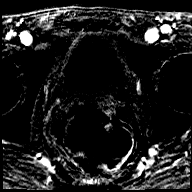
[im 580/580]
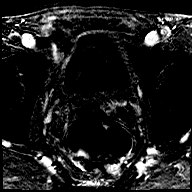

[Series 13: t1_vibe_dixon_tra_f · axial · 2.5mm · 0.91mm/px · z∈[-89,+109]mm · 2 of 80 slices shown]
[im 1/80]
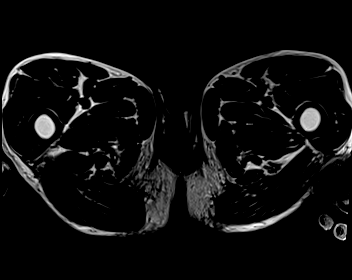
[im 80/80]
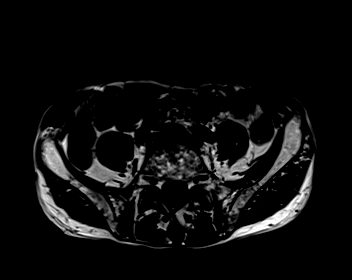

[Series 14: t1_vibe_dixon_tra_w · axial · 2.5mm · 0.91mm/px · z∈[-89,+109]mm · 2 of 80 slices shown]
[im 1/80]
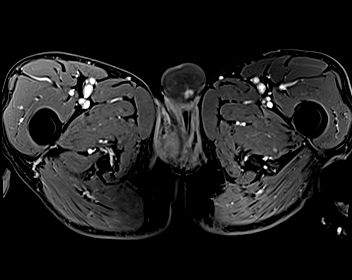
[im 80/80]
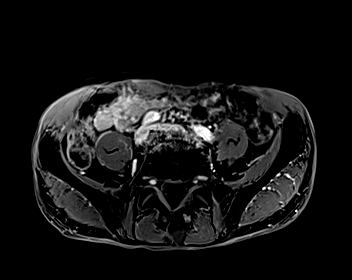

[48 of 48 positions shown; findings below may reference images not displayed]

FINDINGS: Prostate:

Mild hazy low T2 signal stranding in the peripheral zone bilaterally
is nonfocal and relatively symmetric, and likely postinflammatory,
considered PI-RADS category 2.

Ill-defined mildly accentuated restricted diffusions signal in the
left side of the prostate gland and is poorly corroborated on the T2
weighted images.

No specific lesion with intermediate or higher suspicion for
prostate cancer identified.

Volume: 3D volumetric analysis: Prostate volume 14.61 cc (4.1 by
by 2.9 cm).

Transcapsular spread:  Absent

Seminal vesicle involvement: Absent

Neurovascular bundle involvement: Absent

Pelvic adenopathy: Absent

Bone metastasis: Absent

Other findings: No supplemental non-categorized findings.
IMPRESSION: 1. Currently no lesions of intermediate or higher suspicion for
prostate cancer are identified by MRI. Fairly small volume prostate
gland at 14.6 cc.

## 2021-07-29 MED ORDER — GADOBENATE DIMEGLUMINE 529 MG/ML IV SOLN
11.0000 mL | Freq: Once | INTRAVENOUS | Status: AC | PRN
  Administered 2021-07-29: 11 mL via INTRAVENOUS

## 2021-08-02 ENCOUNTER — Telehealth: Payer: Self-pay

## 2021-08-02 ENCOUNTER — Telehealth: Payer: Self-pay | Admitting: Lab

## 2021-08-02 NOTE — Chronic Care Management (AMB) (Signed)
    Chronic Care Management Pharmacy Assistant   Name: Fabiano Ginley  MRN: [id number] DOB: [birth year]  Keith [name] is an 70 y.o. year old male who presents for his initial CCM visit with the clinical pharmacist.  Reason for Encounter: Initial Visit    Recent office visits:  06/07/21 Janith Lima, MD-PCP (HTN, Hyperlipidemia) med changes: discontinued gabapentin and indapamide, started mag oxide  Recent consult visits:  06/23/21 Alda Berthold, DO-Neurology (Myelopathy) Orders: MR cervical spine wo contrast, MR Lumbar spine wo contrast, no med changes  06/22/21 Minus Breeding, MD-Cardiology (Bradycardia, sinus)   06/01/21 Lauraine Rinne, NP-Pulmonary Disease (Tobacco use)  Hospital visits:  None in previous 6 months  Medications: Outpatient Encounter Medications as of 08/02/2021  Medication Sig   Cholecalciferol 1.25 MG (50000 UT) capsule Take 1 capsule (50,000 Units total) by mouth once a week.   doxycycline 100 mg in sodium chloride 0.9 % 250 mL Inject 100 mg into the vein every 12 (twelve) hours.   Magnesium Oxide 400 MG CAPS Take 1 capsule (400 mg total) by mouth daily.   rosuvastatin (CRESTOR) 5 MG tablet Take 1 tablet (5 mg total) by mouth daily.   No facility-administered encounter medications on file as of 08/02/2021.   Medication List: Cholecalciferol 1.25 MG (50000 UT) capsule-last fill 05/11/21 30 ds doxycycline 100 mg in sodium chloride 0.9 % 250 mL-last fill 06/19/21 30ds Magnesium Oxide 400 MG CAPS-last fill 06/07/21 90 ds rosuvastatin (CRESTOR) 5 MG tablet-last fill 06/19/21 90 ds  Care Gaps: Colonoscopy-06/13/21 Diabetic Foot Exam-NA Ophthalmology-NA Dexa Scan - NA Annual Well Visit - NA Micro albumin-NA Hemoglobin A1c- NA  Star Rating Drugs: rosuvastatin (CRESTOR) 5 MG tablet-last fill 06/19/21 90 ds   Ethelene Hal Clinical Pharmacist Assistant [phone]

## 2021-08-02 NOTE — Chronic Care Management (AMB) (Signed)
  Chronic Care Management   Note  08/02/2021 Name: Keith [name] MRN: [id number] DOB: 1950/10/11  Keith [name] is a 70 y.o. year old male who is a primary care patient of Janith Lima, MD. I reached out to [facility] by phone today in response to a referral sent by Keith [name]'s PCP, Janith Lima, MD.   Keith [name] was given information about Chronic Care Management services today including:  CCM service includes personalized support from designated clinical staff supervised by his physician, including individualized plan of care and coordination with other care providers 24/7 contact phone numbers for assistance for urgent and routine care needs. Service will only be billed when office clinical staff spend 20 minutes or more in a month to coordinate care. Only one practitioner may furnish and bill the service in a calendar month. The patient may stop CCM services at any time (effective at the end of the month) by phone call to the office staff.   Patient agreed to services and verbal consent obtained.   Follow up plan:  [place]

## 2021-08-07 ENCOUNTER — Ambulatory Visit: Payer: Medicare HMO

## 2021-08-08 NOTE — Telephone Encounter (Signed)
NA

## 2021-08-22 DIAGNOSIS — Z23 Encounter for immunization: Secondary | ICD-10-CM | POA: Diagnosis not present

## 2021-08-22 DIAGNOSIS — L700 Acne vulgaris: Secondary | ICD-10-CM | POA: Diagnosis not present

## 2021-08-24 ENCOUNTER — Ambulatory Visit: Payer: Medicare HMO

## 2021-08-24 ENCOUNTER — Other Ambulatory Visit: Payer: Self-pay

## 2021-08-24 DIAGNOSIS — E785 Hyperlipidemia, unspecified: Secondary | ICD-10-CM

## 2021-08-24 DIAGNOSIS — G629 Polyneuropathy, unspecified: Secondary | ICD-10-CM

## 2021-08-24 NOTE — Patient Instructions (Signed)
Visit Information  Following are the goals we discussed today:   Manage My Medications   Timeframe:  Long-Range Goal Priority:  High Start Date:   08/24/2021                         Expected End Date: 08/24/2022                      Follow Up Date 02/22/2022    - call for medicine refill 2 or 3 days before it runs out - call if I am sick and can't take my medicine - keep a list of all the medicines I take; vitamins and herbals too    Why is this important?   These steps will help you keep on track with your medicines.  Plan: Telephone follow up appointment with care management team member scheduled for:  6 months The patient has been provided with contact information for the care management team and has been advised to call with any health related questions or concerns.   [name], PharmD Clinical Pharmacist, [facility]    Please call the care guide team at [phone] if you need to cancel or reschedule your appointment.   The patient verbalized understanding of instructions, educational materials, and care plan provided today and declined offer to receive copy of patient instructions, educational materials, and care plan.

## 2021-08-24 NOTE — Progress Notes (Signed)
Chronic Care Management Pharmacy Note  08/24/2021 Name:  Keith [name] MRN:  [id number] DOB:  01/30/1951  Summary: -since last visit patient's gabapentin and indapamide have been discontinued  -Patient reports that recently his leg cramps / pains have been much better, issue still remains weakness in his legs and neuropathy  -Was able to see neurology - had MRI completed - no changes to medications -Patient recently joined the [facility] - plans to start working out / walking more  -Started on tamsulosin by urologist - notes to improvement in urge/ frequency of urination  Recommendations/Changes made from today's visit: -Recommending no changes in medications - encouraged patient to increase activity as he is able, advised to use cane/walker for ambulation if needed - patient to continue monitoring swelling of feet, reach out with any issues or concerns   Subjective: Keith [name] is an 70 y.o. year old male who is a primary patient of Janith Lima, MD.  The CCM team was consulted for assistance with disease management and care coordination needs.    Engaged with patient by telephone for follow up visit in response to provider referral for pharmacy case management and/or care coordination services.   Consent to Services:  The patient was given the following information about Chronic Care Management services today, agreed to services, and gave verbal consent: 1. CCM service includes personalized support from designated clinical staff supervised by the primary care provider, including individualized plan of care and coordination with other care providers 2. 24/7 contact phone numbers for assistance for urgent and routine care needs. 3. Service will only be billed when office clinical staff spend 20 minutes or more in a month to coordinate care. 4. Only one practitioner may furnish and bill the service in a calendar month. 5.The patient may stop CCM services at any time (effective at the end  of the month) by phone call to the office staff. 6. The patient will be responsible for cost sharing (co-pay) of up to 20% of the service fee (after annual deductible is met). Patient agreed to services and consent obtained.  Patient Care Team: Janith Lima, MD as PCP - General (Internal Medicine) Alda Berthold, DO as Consulting Physician (Neurology) [name], [facility] as Pharmacist (Pharmacist)  Recent office visits:  06/07/2021 - Dr. Ronnald Ramp -f/u for HTN and HLD - gabapentin and indapamide discontinued - f/u in 3 months    Recent consult visits:  07/12/2021 - Dr. Annette Stable - [facility] - Referred for evaluation of neck pain / weakness  06/23/2021 - Dr. Posey Pronto - Neurology - evaluation of bilateral leg weakness and gait instability - MRI ordered   06/22/2021 - Dr. Percival Spanish - Cardiology - given zio monitor - no medication changes  06/01/2021 - Dr. Warner Mccreedy - Pulmonology - no changes to medications - smoking cessation encouraged   Hospital visits:  None in previous 6 months  Objective:  Lab Results  Component Value Date   CREATININE 1.09 06/07/2021   BUN 17 06/07/2021   GFR 68.94 06/07/2021   NA 139 06/07/2021   K 4.1 06/07/2021   CALCIUM 9.6 06/07/2021   CO2 31 06/07/2021   GLUCOSE 87 06/07/2021    Lab Results  Component Value Date/Time   GFR 68.94 06/07/2021 03:55 PM   GFR 80.39 04/06/2021 02:42 PM    Last diabetic Eye exam:  No results found for: HMDIABEYEEXA  Last diabetic Foot exam:  No results found for: HMDIABFOOTEX   Lab Results  Component  Value Date   CHOL 112 04/06/2021   HDL 54.00 04/06/2021   LDLCALC 37 04/06/2021   TRIG 105.0 04/06/2021   CHOLHDL 2 04/06/2021    Hepatic Function Latest Ref Rng & Units 04/06/2021  Total Protein 6.0 - 8.3 g/dL 7.2  Albumin 3.5 - 5.2 g/dL 4.3  AST 0 - 37 U/L 20  ALT 0 - 53 U/L 9  Alk Phosphatase 39 - 117 U/L 81  Total Bilirubin 0.2 - 1.2 mg/dL 0.5  Bilirubin, Direct 0.0 - 0.3 mg/dL 0.1     Lab Results  Component Value Date/Time   TSH 2.57 04/06/2021 02:42 PM    CBC Latest Ref Rng & Units 06/07/2021 04/06/2021  WBC 4.0 - 10.5 K/uL 3.0(L) 3.1(L)  Hemoglobin 13.0 - 17.0 g/dL 11.0(L) 13.0  Hematocrit 39.0 - 52.0 % 33.3(L) 39.9  Platelets 150.0 - 400.0 K/uL 223.0 232.0    Lab Results  Component Value Date/Time   VD25OH 14.23 (L) 04/06/2021 02:42 PM    Clinical ASCVD: No  The ASCVD Risk score (Arnett DK, et al., 2019) failed to calculate for the following reasons:   The valid total cholesterol range is 130 to 320 mg/dL    Depression screen [facility] 2/9 04/06/2021  Decreased Interest 0  Down, Depressed, Hopeless 0  PHQ - 2 Score 0     Social History   Tobacco Use  Smoking Status Every Day   Packs/day: 0.50   Years: 40.00   Pack years: 20.00   Types: Cigarettes   Start date: 02/15/1969  Smokeless Tobacco Never   BP Readings from Last 3 Encounters:  06/23/21 103/70  06/22/21 110/68  06/13/21 130/75   Pulse Readings from Last 3 Encounters:  06/23/21 (!) 58  06/22/21 (!) 58  06/13/21 66   Wt Readings from Last 3 Encounters:  06/23/21 124 lb (56.2 kg)  06/22/21 125 lb 12.8 oz (57.1 kg)  06/13/21 119 lb 8 oz (54.2 kg)   BMI Readings from Last 3 Encounters:  06/23/21 20.32 kg/m  06/22/21 20.93 kg/m  06/13/21 19.89 kg/m    Assessment/Interventions: Review of patient past medical history, allergies, medications, health status, including review of consultants reports, laboratory and other test data, was performed as part of comprehensive evaluation and provision of chronic care management services.   SDOH:  (Social Determinants of Health) assessments and interventions performed: Yes  SDOH Screenings   Alcohol Screen: Not on file  Depression (PHQ2-9): Low Risk    PHQ-2 Score: 0  Financial Resource Strain: Low Risk    Difficulty of Paying Living Expenses: Not very hard  Food Insecurity: Not on file  Housing: Not on file  Physical Activity: Not on  file  Social Connections: Not on file  Stress: Not on file  Tobacco Use: High Risk   Smoking Tobacco Use: Every Day   Smokeless Tobacco Use: Never   Passive Exposure: Not on file  Transportation Needs: Not on file    [place]  No Known Allergies  Medications Reviewed Today     Reviewed by [name], [facility] (Pharmacist) on 08/24/21 at [place] List Status: <None>   Medication Order Taking? Sig Documenting Provider Last Dose Status Informant  Cholecalciferol 1.25 MG (50000 UT) capsule [id number] Yes Take 1 capsule (50,000 Units total) by mouth once a week. Janith Lima, MD Taking Active   doxycycline (ADOXA) 100 MG tablet [id number] Yes Take 100 mg by mouth daily. [provider] Taking Active   Magnesium Oxide 400 MG  CAPS [id number] Yes Take 1 capsule (400 mg total) by mouth daily. Janith Lima, MD Taking Active   rosuvastatin (CRESTOR) 5 MG tablet [id number] Yes Take 1 tablet (5 mg total) by mouth daily. Janith Lima, MD Taking Active   tamsulosin [facility]) 0.4 MG CAPS capsule [id number] Yes Take 0.4 mg by mouth. [provider] Taking Active             Patient Active Problem List   Diagnosis Date Noted   Acute left ankle pain 06/07/2021   Need for immunization against influenza 06/07/2021   Hypomagnesemia syndrome 06/07/2021   Encounter for general adult medical examination with abnormal findings 04/08/2021   PSA elevation 04/07/2021   Vitamin D deficiency disease 04/07/2021   Mucopurulent chronic bronchitis ([place]) 04/06/2021   Primary hypertension 04/06/2021   Tobacco abuse 04/06/2021   Abnormal physical evaluation 04/06/2021   Screen for colon cancer 04/06/2021   Bradycardia, sinus 04/06/2021   Benign prostatic hyperplasia with urinary frequency 04/06/2021   Dyslipidemia, goal LDL below 100 04/06/2021   Neuropathy 04/06/2021   Claudication of both lower extremities ([place]) 04/06/2021   ALCOHOL ABUSE 08/25/2009   COPD 08/25/2009   GERD  08/25/2009   DYSPHAGIA 08/25/2009   WEIGHT LOSS 08/24/2009    Immunization History  Administered Date(s) Administered   Fluad Quad(high Dose 65+) 06/07/2021   PFIZER(Purple Top)SARS-COV-2 Vaccination 10/23/2019, 11/13/2019, 06/27/2020   PNEUMOCOCCAL CONJUGATE-20 04/06/2021    Conditions to be addressed/monitored:  Hypertension, Hyperlipidemia, vitamin D deficiency and neuropathy  Care Plan : CCM Care Plan  Updates made by [name], [place] since 08/24/2021 12:00 AM     Problem: HTN, HLD, Vitamin D deficiency, Neuropathy   Priority: High  Onset Date: 04/28/2021     Long-Range Goal: Disease Management   Start Date: 04/28/2021  Expected End Date: 10/29/2021  This Visit's Progress: On track  Recent Progress: On track  Priority: High  Note:   Current Barriers:  Unable to independently monitor therapeutic efficacy Unable to maintain control of Blood pressure  Pharmacist Clinical Goal(s):  Patient will verbalize ability to afford treatment regimen achieve adherence to monitoring guidelines and medication adherence to achieve therapeutic efficacy achieve control of BP and vitamin D levels as evidenced by bp logs and next  vitamin D level maintain control of LDL as evidenced by next lipid panel  through collaboration with PharmD and provider.   Interventions: 1:1 collaboration with Janith Lima, MD regarding development and update of comprehensive plan of care as evidenced by provider attestation and co-signature Inter-disciplinary care team collaboration (see longitudinal plan of care) Comprehensive medication review performed; medication list updated in electronic medical record  Hypertension (BP goal <140/90) - Controlled -Current treatment: N/a - diet and lifestyle controlled  -Medications previously tried: verapamil, indapamide -Current home readings: n/a - has not been checking,  reports that he has a BP cuff but it is not working properly at this time  -Current  dietary habits: reports to trying to watch sodium intake  -Current exercise habits: reports to being active caring for his house and doing yardwork -Denies hypotensive/hypertensive symptoms -Educated on BP goals and benefits of medications for prevention of heart attack, stroke and kidney damage; Daily salt intake goal < 2300 mg; Exercise goal of 150 minutes per week; Importance of home blood pressure monitoring; Proper BP monitoring technique; Symptoms of hypotension and importance of maintaining adequate hydration; -Counseled on diet and exercise extensively Recommended to with lifestyle and diet for control   Hyperlipidemia: (  LDL goal < 100) -Controlled Lab Results  Component Value Date   LDLCALC 37 04/06/2021  -Current treatment: Rosuvastatin 89m - 1 tablet daily  -Medications previously tried: n/a  -Current dietary patterns: reports to limited intake of foods high in cholesterol -Current exercise habits: reports to being active caring for his house and doing yardwork -Educated on Cholesterol goals;  Benefits of statin for ASCVD risk reduction; Importance of limiting foods high in cholesterol; Exercise goal of 150 minutes per week; -Counseled on diet and exercise extensively Recommended to continue current medication  Vitamin D Deficiency (Goal: Maintenance of appropriate vitamin d levels) -Not ideally controlled - level has not been rechecked since starting current vitamin D supplementation    Last vitamin D Lab Results  Component Value Date   VD25OH 14.23 (L) 04/06/2021  -Current treatment  Vitamin D3 50,000 units - 1 capsule once weekly  -Medications previously tried: n/a  -Recommended to continue current medication - will be due for updated Vit D level with next pcp appointment   Neuropathy (Goal: Pain control) -Controlled -Current treatment N/a  -Medications previously tried: naproxen, etodolac, gabapentin -Recommended to continue current medication -Patient  reports that pains and swelling has decreased since stopping gabapentin and indapamide, notes to continued neuropathy but has bene improved recently, plans to start exercising more in hopes to further improve    Patient Goals/Self-Care Activities Patient will:  - take medications as prescribed target a minimum of 150 minutes of moderate intensity exercise weekly engage in dietary modifications by reducing sodium and foods high in cholesterol   Follow Up Plan: Telephone follow up appointment with care management team member scheduled for: 6 months  The patient has been provided with contact information for the care management team and has been advised to call with any health related questions or concerns.         Medication Assistance: None required.  Patient affirms current coverage meets needs.  [facility] [address]Phone: [phone]: [phone] [id number] [place] [place] [place]Phone: [phone]: [phone]  Uses pill box? No - able to manage without  Pt endorses 100% compliance  Care Plan and Follow Up Patient Decision:  Patient agrees to Care Plan and Follow-up.  Plan: Telephone follow up appointment with care management team member scheduled for:  1 month and The patient has been provided with contact information for the care management team and has been advised to call with any health related questions or concerns.   DTomasa Blase PharmD Clinical Pharmacist, [place]

## 2021-08-28 ENCOUNTER — Other Ambulatory Visit: Payer: Self-pay | Admitting: Internal Medicine

## 2021-08-28 ENCOUNTER — Telehealth: Payer: Self-pay | Admitting: Internal Medicine

## 2021-08-28 DIAGNOSIS — E785 Hyperlipidemia, unspecified: Secondary | ICD-10-CM

## 2021-08-28 DIAGNOSIS — E559 Vitamin D deficiency, unspecified: Secondary | ICD-10-CM

## 2021-08-28 MED ORDER — CHOLECALCIFEROL 1.25 MG (50000 UT) PO CAPS: 50000.0000 [IU] | ORAL_CAPSULE | ORAL | 0 refills | Status: DC

## 2021-08-28 NOTE — Telephone Encounter (Signed)
1.Medication Requested: Cholecalciferol  2. Pharmacy (Name, Street, [place]): [facility] [place], [place]  3. On Med List: yes  4. Last Visit with PCP: 12.08.22  5. Next visit date with PCP: N/A  Agent: Please be advised that RX refills may take up to 3 business days. We ask that you follow-up with your pharmacy.

## 2021-08-29 ENCOUNTER — Telehealth: Payer: Self-pay

## 2021-08-29 NOTE — Telephone Encounter (Signed)
Patient is unable to afford the rosuvastatin and if he could be put on something else or receive patient assistance?   Please return call at [phone]

## 2021-09-04 NOTE — Telephone Encounter (Signed)
Called and spoke with [facility] pharmacy - rosuvastatin is ready and has no copay, left message for patient to return call to discuss

## 2021-09-19 ENCOUNTER — Telehealth: Payer: Self-pay

## 2021-09-19 ENCOUNTER — Other Ambulatory Visit: Payer: Self-pay | Admitting: Internal Medicine

## 2021-09-19 DIAGNOSIS — E785 Hyperlipidemia, unspecified: Secondary | ICD-10-CM

## 2021-09-19 MED ORDER — ROSUVASTATIN CALCIUM 5 MG PO TABS: 5.0000 mg | ORAL_TABLET | Freq: Every day | ORAL | 1 refills | Status: DC

## 2021-09-19 NOTE — Telephone Encounter (Signed)
Pt is requesting a refill on rosuvastatin (CRESTOR) 5 MG tablet be sent to [place]   Phone number [phone]  Address [address] [place], [place] 06/07/21

## 2021-10-10 ENCOUNTER — Ambulatory Visit (INDEPENDENT_AMBULATORY_CARE_PROVIDER_SITE_OTHER)
Admission: RE | Admit: 2021-10-10 | Discharge: 2021-10-10 | Disposition: A | Discharge status: Home / Self Care | Payer: Medicare HMO | Source: Ambulatory Visit | Attending: Acute Care | Admitting: Acute Care

## 2021-10-10 ENCOUNTER — Other Ambulatory Visit: Payer: Self-pay

## 2021-10-10 DIAGNOSIS — Z87891 Personal history of nicotine dependence: Secondary | ICD-10-CM

## 2021-10-10 DIAGNOSIS — J439 Emphysema, unspecified: Secondary | ICD-10-CM | POA: Diagnosis not present

## 2021-10-10 DIAGNOSIS — F17210 Nicotine dependence, cigarettes, uncomplicated: Secondary | ICD-10-CM | POA: Diagnosis not present

## 2021-10-10 DIAGNOSIS — Z122 Encounter for screening for malignant neoplasm of respiratory organs: Secondary | ICD-10-CM

## 2021-10-10 DIAGNOSIS — R911 Solitary pulmonary nodule: Secondary | ICD-10-CM

## 2021-10-10 DIAGNOSIS — I700 Atherosclerosis of aorta: Secondary | ICD-10-CM

## 2021-10-10 IMAGING — CT CT CHEST LCS NODULE FOLLOW-UP W/O CM
2 of 5 series · 15 of 40 positions shown, 18 images · non-contrast
Comparison: Chest CT [DATE].

CLINICAL DATA: 70-year-old male current smoker with 23 pack-year
history of smoking. Follow-up for prior abnormal low-dose lung
cancer screening examination.

EXAM:
CT CHEST WITHOUT CONTRAST FOR LUNG CANCER SCREENING NODULE FOLLOW-UP
TECHNIQUE: Multidetector CT imaging of the chest was performed following the
standard protocol without IV contrast.
RADIATION DOSE REDUCTION: This exam was performed according to the
departmental dose-optimization program which includes automated
exposure control, adjustment of the mA and/or kV according to
patient size and/or use of iterative reconstruction technique.

[Series 3: lung thins 1.0 · axial · 0.67mm/px · z∈[-327,-23]mm · 12 of 336 slices shown, 15 images]
[im 16/336  mediastinal]
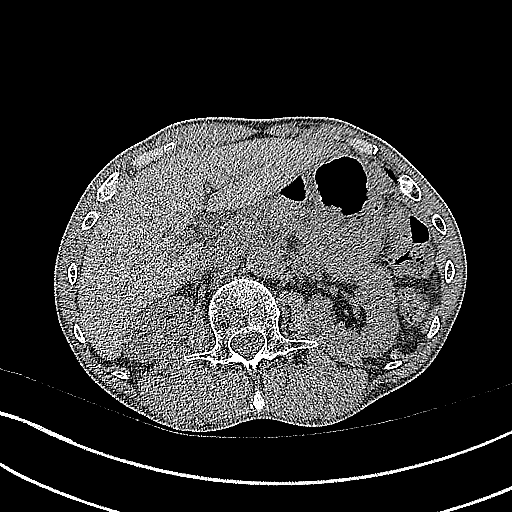
[im 16/336  lung]
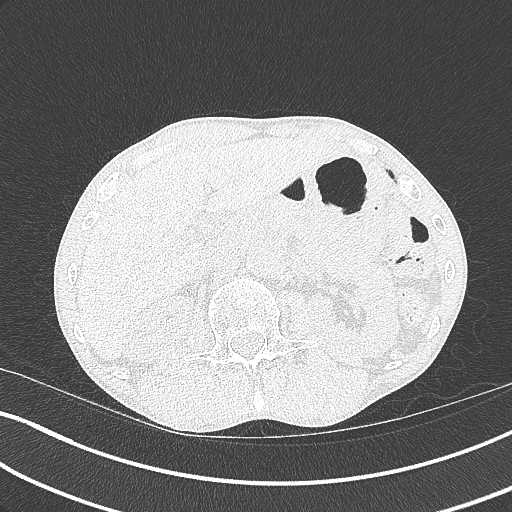
[im 48/336  lung]
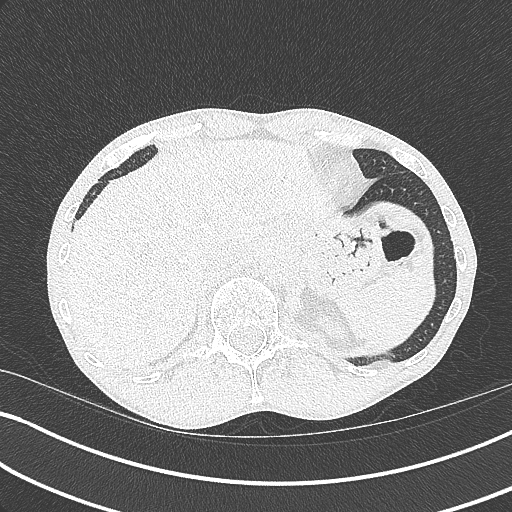
[im 80/336  lung]
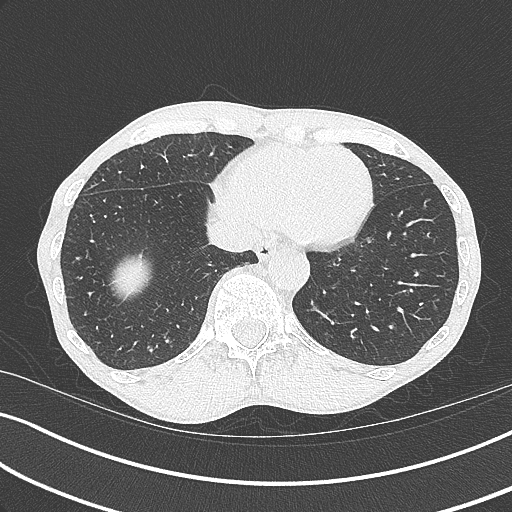
[im 96/336  lung]
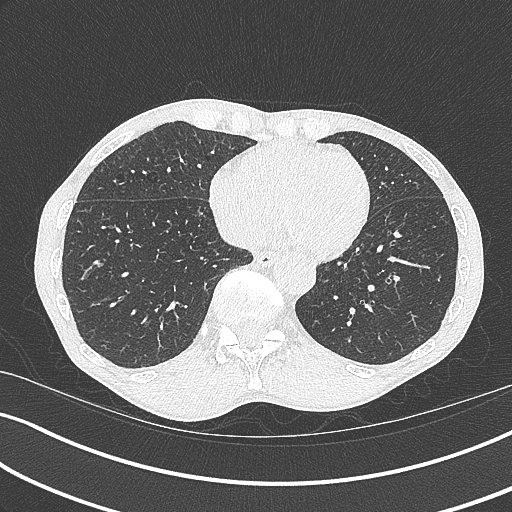
[im 128/336  mediastinal]
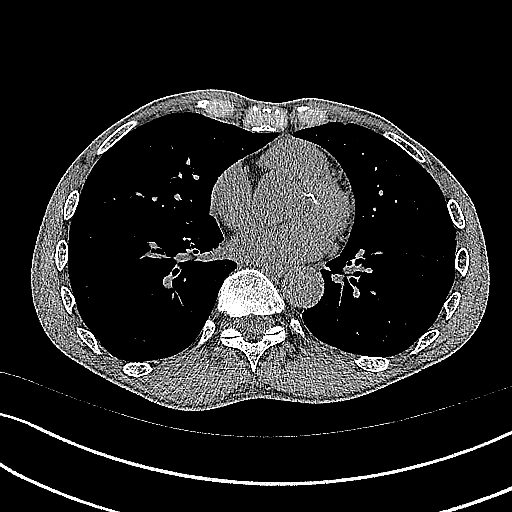
[im 128/336  lung]
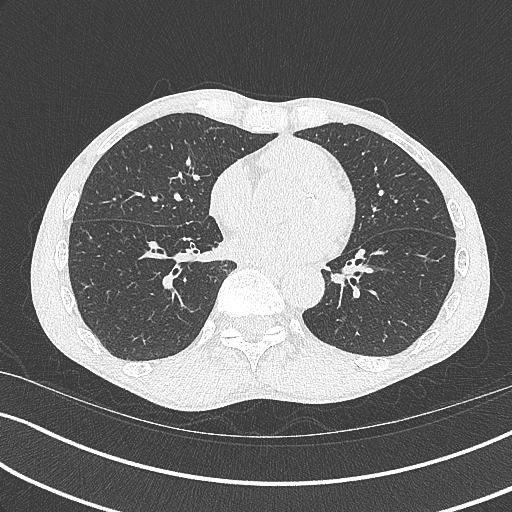
[im 160/336  lung]
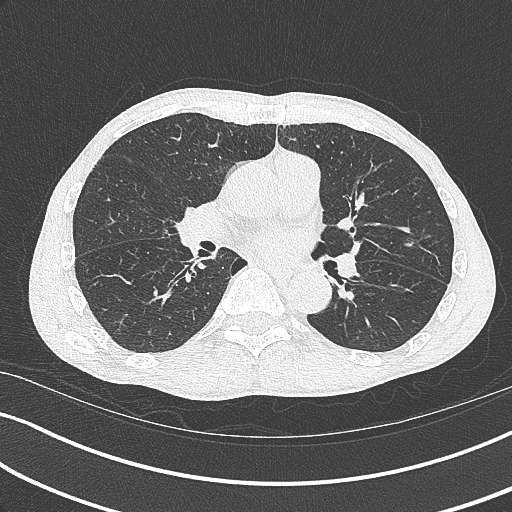
[im 176/336  lung]
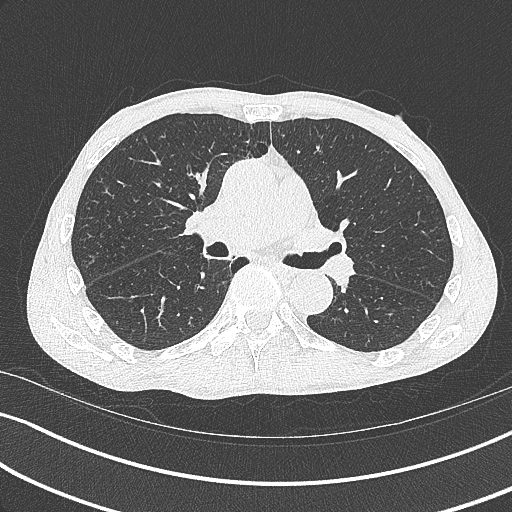
[im 208/336  lung]
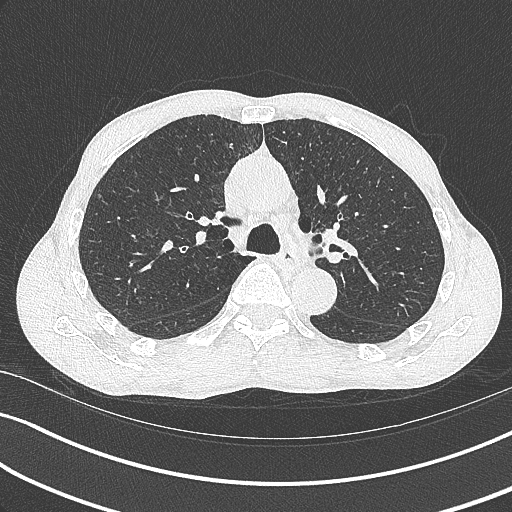
[im 240/336  mediastinal]
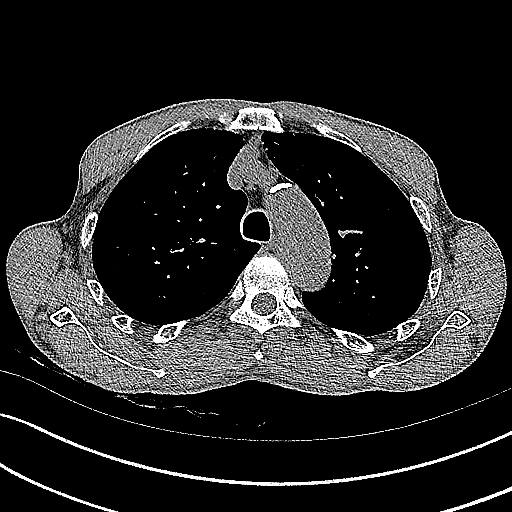
[im 240/336  lung]
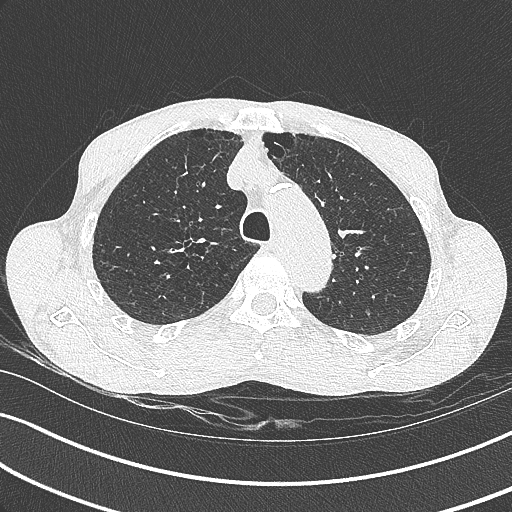
[im 256/336  lung]
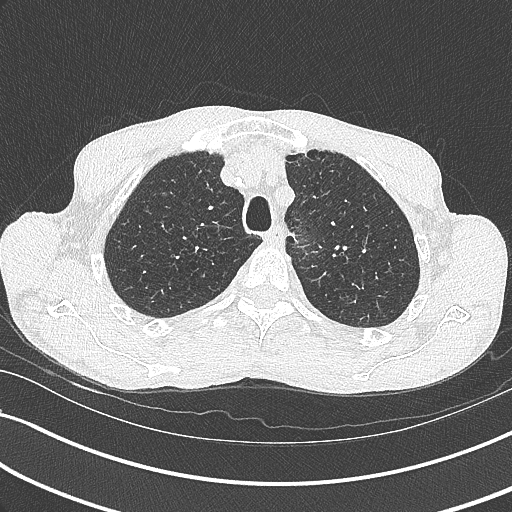
[im 288/336  lung]
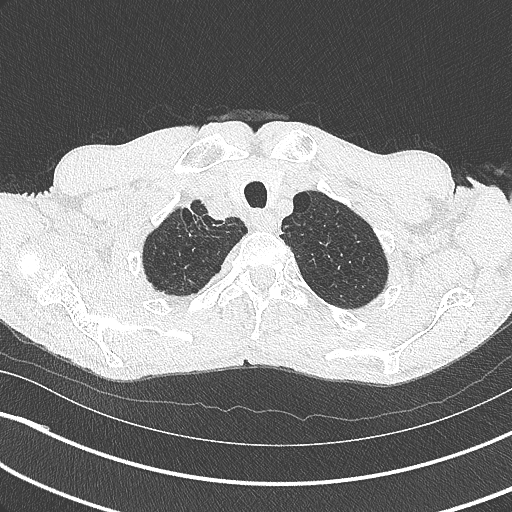
[im 320/336  lung]
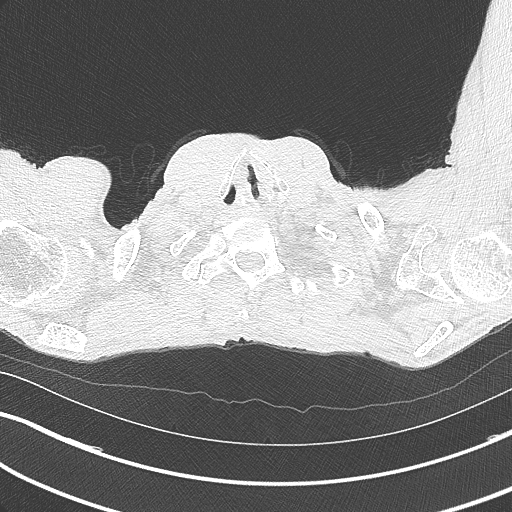

[Series 4: coronal · coronal · 0.64mm/px · 3 of 199 slices shown]
[im 40/199  lung]
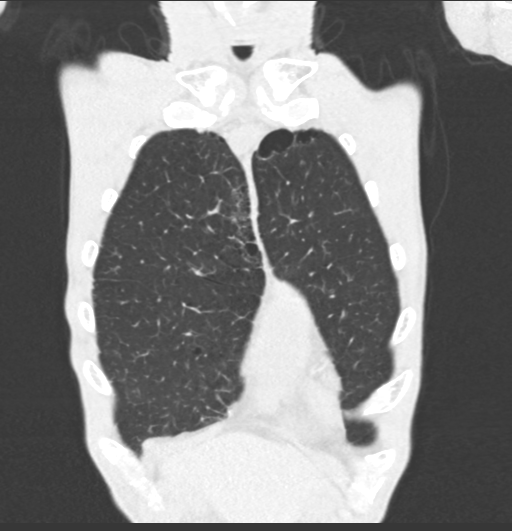
[im 80/199  lung]
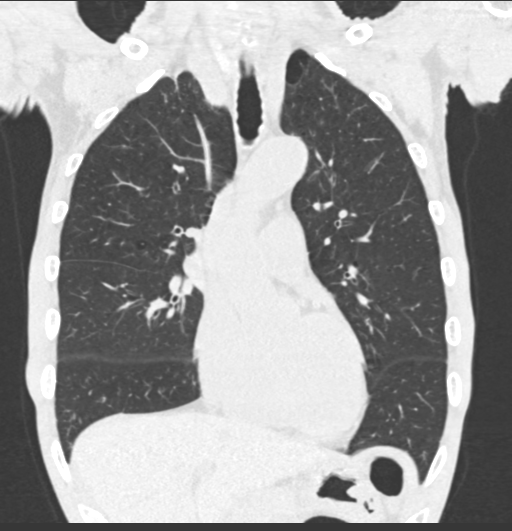
[im 119/199  lung]
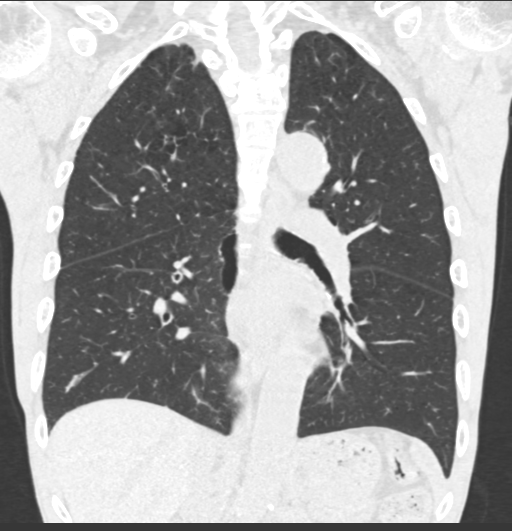

[15 of 40 positions shown; findings below may reference images not displayed]

FINDINGS: Cardiovascular: Heart size is normal. There is no significant
pericardial fluid, thickening or pericardial calcification. There is
aortic atherosclerosis, as well as atherosclerosis of the great
vessels of the mediastinum and the coronary arteries, including
calcified atherosclerotic plaque in the right coronary artery.

Mediastinum/Nodes: No pathologically enlarged mediastinal or hilar
lymph nodes. Esophagus is unremarkable in appearance. No axillary
lymphadenopathy.

Lungs/Pleura: Previously noted nodule of concern in the right upper
lobe near the apex (axial image 60 of series 3) is similar to the
prior examination, with a volume derived mean diameter of 7.9 mm on
today's study, presumably a benign area of chronic post infectious
or inflammatory scarring. Other smaller pulmonary nodules are stable
in number and size and considered benign. No other larger more
suspicious appearing pulmonary nodules or masses are noted. No acute
consolidative airspace disease. No pleural effusions. Mild diffuse
bronchial wall thickening with mild centrilobular and paraseptal
emphysema.

Upper Abdomen: 3 mm nonobstructive calculus in the upper pole
collecting system of the left kidney. Atherosclerotic calcifications
in the abdominal aorta.

Musculoskeletal: There are no aggressive appearing lytic or blastic
lesions noted in the visualized portions of the skeleton.
IMPRESSION: 1. Lung-RADS 2S, benign appearance or behavior. Continue annual
screening with low-dose chest CT without contrast in 12 months.
2. The "S" modifier above refers to potentially clinically
significant non lung cancer related findings. Specifically, there is
aortic atherosclerosis, in addition to right coronary artery
disease. Please note that although the presence of coronary artery
calcium documents the presence of coronary artery disease, the
severity of this disease and any potential stenosis cannot be
assessed on this non-gated CT examination. Assessment for potential
risk factor modification, dietary therapy or pharmacologic therapy
may be warranted, if clinically indicated.
3. Mild diffuse bronchial wall thickening with mild centrilobular
and paraseptal emphysema; imaging findings suggestive of underlying
COPD.
4. 3 mm nonobstructive calculus in the upper pole collecting system
of the right kidney.

Aortic Atherosclerosis ([MT]-[MT]) and Emphysema ([MT]-[MT]).

## 2021-10-12 ENCOUNTER — Other Ambulatory Visit: Payer: Self-pay | Admitting: Acute Care

## 2021-10-12 DIAGNOSIS — Z87891 Personal history of nicotine dependence: Secondary | ICD-10-CM

## 2021-10-12 DIAGNOSIS — F17200 Nicotine dependence, unspecified, uncomplicated: Secondary | ICD-10-CM

## 2021-10-19 ENCOUNTER — Telehealth: Payer: Self-pay | Admitting: Internal Medicine

## 2021-10-19 NOTE — Telephone Encounter (Signed)
Patient calling in  Would like for provider to review letter from his recent lung cancer screening 10/12/21 & give his recommendations if any  Please call patient [phone]

## 2021-10-24 DIAGNOSIS — R972 Elevated prostate specific antigen [PSA]: Secondary | ICD-10-CM | POA: Diagnosis not present

## 2021-10-24 LAB — PSA: PSA: 3.8

## 2021-10-25 DIAGNOSIS — Z23 Encounter for immunization: Secondary | ICD-10-CM | POA: Diagnosis not present

## 2021-10-25 DIAGNOSIS — L700 Acne vulgaris: Secondary | ICD-10-CM | POA: Diagnosis not present

## 2021-11-06 ENCOUNTER — Other Ambulatory Visit: Payer: Self-pay | Admitting: Internal Medicine

## 2021-11-06 DIAGNOSIS — E559 Vitamin D deficiency, unspecified: Secondary | ICD-10-CM

## 2021-12-05 ENCOUNTER — Other Ambulatory Visit: Payer: Self-pay | Admitting: Internal Medicine

## 2022-01-05 ENCOUNTER — Telehealth: Payer: Self-pay

## 2022-01-05 NOTE — Progress Notes (Signed)
? ? ?  Chronic Care Management ?Pharmacy Assistant  ? ?Name: Keith [name]  MRN: [id number] DOB: 1951/01/12 ? ?Reason for Encounter: Disease State-General ?  ?Recent office visits:  ?None since last coordination call ? ?Recent consult visits:  ?None since last coordination call ? ?Hospital visits:  ?None in previous 6 months ? ?Medications: ?Outpatient Encounter Medications as of 01/05/2022  ?Medication Sig  ? Cholecalciferol (VITAMIN D3) 1.25 MG (50000 UT) CAPS TAKE 1 CAPSULE EVERY WEEK  ? doxycycline (ADOXA) 100 MG tablet Take 100 mg by mouth daily.  ? MAGNESIUM-OXIDE 400 (240 Mg) MG tablet TAKE 1 TABLET BY MOUTH EVERY DAY  ? rosuvastatin (CRESTOR) 5 MG tablet Take 1 tablet (5 mg total) by mouth daily.  ? tamsulosin (FLOMAX) 0.4 MG CAPS capsule Take 0.4 mg by mouth.  ? ?No facility-administered encounter medications on file as of 01/05/2022.  ? ?Have you had any problems recently with your health?Patient states that he has had some cramping and swelling in his legs and feet. He states that the swelling in his feet have gone down but still having sharp pain that shoos up through his legs and feet.He also states that he is having numbness in both feet as well. Patient states that he is not taking any medication for swelling. ? ?Have you had any problems with your pharmacy?Patient states that he is not having any problems with getting medications from the pharmacy or the cost of medications ? ?What issues or side effects are you having with your medications?Patient states that he has constipation and not sure which medication is causing it ? ?What would you like me to pass along to [facility] for them to help you with? Patient states that he would like something that will help with the constipation ? ?What can we do to take care of you better? Patient states nothing at this time ? ?Care Gaps: ?Colonoscopy-06/13/21 ?Diabetic Foot Exam-NA ?Ophthalmology-NA ?Dexa Scan - NA ?Annual Well Visit - NA ?Micro  albumin-NA ?Hemoglobin A1c- NA ? ?Star Rating Drugs: ?Rosuvastatin 5 mg-last fill 12/02/21 90 ds ? ?Ethelene Hal ?Clinical Pharmacist Assistant ?[phone]  ?

## 2022-02-14 ENCOUNTER — Other Ambulatory Visit: Payer: Self-pay | Admitting: Internal Medicine

## 2022-02-14 DIAGNOSIS — E785 Hyperlipidemia, unspecified: Secondary | ICD-10-CM

## 2022-02-22 ENCOUNTER — Telehealth: Payer: Medicare HMO

## 2022-02-22 NOTE — Progress Notes (Deleted)
Chronic Care Management Pharmacy Note  02/22/2022 Name:  Keith [name] MRN:  [id number] DOB:  10-07-1950  Summary: -since last visit patient's gabapentin and indapamide have been discontinued  -Patient reports that recently his leg cramps / pains have been much better, issue still remains weakness in his legs and neuropathy  -Was able to see neurology - had MRI completed - no changes to medications -Patient recently joined the [facility] - plans to start working out / walking more  -Started on tamsulosin by urologist - notes to improvement in urge/ frequency of urination  Recommendations/Changes made from today's visit: -Recommending no changes in medications - encouraged patient to increase activity as he is able, advised to use cane/walker for ambulation if needed - patient to continue monitoring swelling of feet, reach out with any issues or concerns   Subjective: Keith [name] is an 71 y.o. year old male who is a primary patient of Janith Lima, MD.  The CCM team was consulted for assistance with disease management and care coordination needs.    Engaged with patient by telephone for follow up visit in response to provider referral for pharmacy case management and/or care coordination services.   Consent to Services:  The patient was given the following information about Chronic Care Management services today, agreed to services, and gave verbal consent: 1. CCM service includes personalized support from designated clinical staff supervised by the primary care provider, including individualized plan of care and coordination with other care providers 2. 24/7 contact phone numbers for assistance for urgent and routine care needs. 3. Service will only be billed when office clinical staff spend 20 minutes or more in a month to coordinate care. 4. Only one practitioner may furnish and bill the service in a calendar month. 5.The patient may stop CCM services at any time (effective at the end  of the month) by phone call to the office staff. 6. The patient will be responsible for cost sharing (co-pay) of up to 20% of the service fee (after annual deductible is met). Patient agreed to services and consent obtained.  Patient Care Team: Janith Lima, MD as PCP - General (Internal Medicine) Alda Berthold, DO as Consulting Physician (Neurology) Delice Bison Darnelle Maffucci, [facility] as Pharmacist (Pharmacist)  Recent office visits:  None since last visit    Recent consult visits:  None since last visit   Hospital visits:  None in previous 6 months  Objective:  Lab Results  Component Value Date   CREATININE 1.09 06/07/2021   BUN 17 06/07/2021   GFR 68.94 06/07/2021   NA 139 06/07/2021   K 4.1 06/07/2021   CALCIUM 9.6 06/07/2021   CO2 31 06/07/2021   GLUCOSE 87 06/07/2021    Lab Results  Component Value Date/Time   GFR 68.94 06/07/2021 03:55 PM   GFR 80.39 04/06/2021 02:42 PM    Last diabetic Eye exam:  No results found for: "HMDIABEYEEXA"  Last diabetic Foot exam:  No results found for: "HMDIABFOOTEX"   Lab Results  Component Value Date   CHOL 112 04/06/2021   HDL 54.00 04/06/2021   LDLCALC 37 04/06/2021   TRIG 105.0 04/06/2021   CHOLHDL 2 04/06/2021       Latest Ref Rng & Units 04/06/2021    2:42 PM  Hepatic Function  Total Protein 6.0 - 8.3 g/dL 7.2   Albumin 3.5 - 5.2 g/dL 4.3   AST 0 - 37 U/L 20   ALT 0 - 53 U/L 9  Alk Phosphatase 39 - 117 U/L 81   Total Bilirubin 0.2 - 1.2 mg/dL 0.5   Bilirubin, Direct 0.0 - 0.3 mg/dL 0.1     Lab Results  Component Value Date/Time   TSH 2.57 04/06/2021 02:42 PM       Latest Ref Rng & Units 06/07/2021    3:55 PM 04/06/2021    2:42 PM  CBC  WBC 4.0 - 10.5 K/uL 3.0  3.1   Hemoglobin 13.0 - 17.0 g/dL 11.0  13.0   Hematocrit 39.0 - 52.0 % 33.3  39.9   Platelets 150.0 - 400.0 K/uL 223.0  232.0     Lab Results  Component Value Date/Time   VD25OH 14.23 (L) 04/06/2021 02:42 PM    Clinical ASCVD: No  The ASCVD  Risk score (Arnett DK, et al., 2019) failed to calculate for the following reasons:   The valid total cholesterol range is 130 to 320 mg/dL       04/06/2021    1:35 PM  Depression screen PHQ 2/9  Decreased Interest 0  Down, Depressed, Hopeless 0  PHQ - 2 Score 0     Social History   Tobacco Use  Smoking Status Every Day   Packs/day: 0.50   Years: 40.00   Total pack years: 20.00   Types: Cigarettes   Start date: 02/15/1969  Smokeless Tobacco Never   BP Readings from Last 3 Encounters:  06/23/21 103/70  06/22/21 110/68  06/13/21 130/75   Pulse Readings from Last 3 Encounters:  06/23/21 (!) 58  06/22/21 (!) 58  06/13/21 66   Wt Readings from Last 3 Encounters:  06/23/21 124 lb (56.2 kg)  06/22/21 125 lb 12.8 oz (57.1 kg)  06/13/21 119 lb 8 oz (54.2 kg)   BMI Readings from Last 3 Encounters:  06/23/21 20.32 kg/m  06/22/21 20.93 kg/m  06/13/21 19.89 kg/m    Assessment/Interventions: Review of patient past medical history, allergies, medications, health status, including review of consultants reports, laboratory and other test data, was performed as part of comprehensive evaluation and provision of chronic care management services.   SDOH:  (Social Determinants of Health) assessments and interventions performed: Yes  SDOH Screenings   Alcohol Screen: Not on file  Depression (PHQ2-9): Low Risk  (04/06/2021)   Depression (PHQ2-9)    PHQ-2 Score: 0  Financial Resource Strain: Low Risk  (04/28/2021)   Overall Financial Resource Strain (CARDIA)    Difficulty of Paying Living Expenses: Not very hard  Food Insecurity: Not on file  Housing: Not on file  Physical Activity: Not on file  Social Connections: Not on file  Stress: Not on file  Tobacco Use: High Risk (06/23/2021)   Patient History    Smoking Tobacco Use: Every Day    Smokeless Tobacco Use: Never    Passive Exposure: Not on file  Transportation Needs: Not on file    [place]  No Known  Allergies  Medications Reviewed Today     Reviewed by [name], [facility] (Pharmacist) on 08/24/21 at 0905  Med List Status: <None>   Medication Order Taking? Sig Documenting Provider Last Dose Status Informant  Cholecalciferol 1.25 MG (50000 UT) capsule [id number] Yes Take 1 capsule (50,000 Units total) by mouth once a week. Janith Lima, MD Taking Active   doxycycline (ADOXA) 100 MG tablet [id number] Yes Take 100 mg by mouth daily. [provider] Taking Active   Magnesium Oxide 400 MG CAPS [id number] Yes Take 1 capsule (400 mg total) by  mouth daily. Janith Lima, MD Taking Active   rosuvastatin (CRESTOR) 5 MG tablet [id number] Yes Take 1 tablet (5 mg total) by mouth daily. Janith Lima, MD Taking Active   tamsulosin [facility]) 0.4 MG CAPS capsule [id number] Yes Take 0.4 mg by mouth. [provider] Taking Active             Patient Active Problem List   Diagnosis Date Noted   Acute left ankle pain 06/07/2021   Need for immunization against influenza 06/07/2021   Hypomagnesemia syndrome 06/07/2021   Encounter for general adult medical examination with abnormal findings 04/08/2021   PSA elevation 04/07/2021   Vitamin D deficiency disease 04/07/2021   Mucopurulent chronic bronchitis ([place]) 04/06/2021   Primary hypertension 04/06/2021   Tobacco abuse 04/06/2021   Abnormal physical evaluation 04/06/2021   Screen for colon cancer 04/06/2021   Bradycardia, sinus 04/06/2021   Benign prostatic hyperplasia with urinary frequency 04/06/2021   Dyslipidemia, goal LDL below 100 04/06/2021   Neuropathy 04/06/2021   Claudication of both lower extremities ([place]) 04/06/2021   ALCOHOL ABUSE 08/25/2009   COPD 08/25/2009   GERD 08/25/2009   DYSPHAGIA 08/25/2009   WEIGHT LOSS 08/24/2009    Immunization History  Administered Date(s) Administered   Fluad Quad(high Dose 65+) 06/07/2021   PFIZER(Purple Top)SARS-COV-2 Vaccination 10/23/2019, 11/13/2019, 06/27/2020    PNEUMOCOCCAL CONJUGATE-20 04/06/2021    Conditions to be addressed/monitored:  Hypertension, Hyperlipidemia, vitamin D deficiency and neuropathy  There are no care plans that you recently modified to display for this patient.  Medication Assistance: None required.  Patient affirms current coverage meets needs.  Patient's preferred pharmacy is:  [facility] [address], [place] [address] Phone: [phone] Fax: [phone]  [id number] - [place], [place] - [address] [address] Phone: [phone] Fax: [phone]  Uses pill box? No - able to manage without  Pt endorses 100% compliance  Care Plan and Follow Up Patient Decision:  Patient agrees to Care Plan and Follow-up.  Plan: Telephone follow up appointment with care management team member scheduled for:  1 month and The patient has been provided with contact information for the care management team and has been advised to call with any health related questions or concerns.   [name], PharmD Clinical Pharmacist, [place]

## 2022-03-22 ENCOUNTER — Ambulatory Visit (INDEPENDENT_AMBULATORY_CARE_PROVIDER_SITE_OTHER): Payer: Medicare HMO | Admitting: Internal Medicine

## 2022-03-22 ENCOUNTER — Encounter: Payer: Self-pay | Admitting: Internal Medicine

## 2022-03-22 VITALS — BP 110/70 | HR 62 | Temp 98.1°F | Ht 65.5 in | Wt 117.0 lb

## 2022-03-22 DIAGNOSIS — E785 Hyperlipidemia, unspecified: Secondary | ICD-10-CM

## 2022-03-22 DIAGNOSIS — D539 Nutritional anemia, unspecified: Secondary | ICD-10-CM

## 2022-03-22 DIAGNOSIS — R0609 Other forms of dyspnea: Secondary | ICD-10-CM | POA: Insufficient documentation

## 2022-03-22 DIAGNOSIS — I10 Essential (primary) hypertension: Secondary | ICD-10-CM | POA: Diagnosis not present

## 2022-03-22 DIAGNOSIS — J411 Mucopurulent chronic bronchitis: Secondary | ICD-10-CM

## 2022-03-22 LAB — VITAMIN B12: Vitamin B-12: 454 pg/mL (ref 211–911)

## 2022-03-22 LAB — IBC + FERRITIN
Ferritin: 183.3 ng/mL (ref 22.0–322.0)
Iron: 81 ug/dL (ref 42–165)
Saturation Ratios: 27.8 % (ref 20.0–50.0)
TIBC: 291.2 ug/dL (ref 250.0–450.0)
Transferrin: 208 mg/dL — ABNORMAL LOW (ref 212.0–360.0)

## 2022-03-22 LAB — CBC WITH DIFFERENTIAL/PLATELET
Basophils Absolute: 0 10*3/uL (ref 0.0–0.1)
Basophils Relative: 1 % (ref 0.0–3.0)
Eosinophils Absolute: 0.2 10*3/uL (ref 0.0–0.7)
Eosinophils Relative: 6.1 % — ABNORMAL HIGH (ref 0.0–5.0)
HCT: 34.4 % — ABNORMAL LOW (ref 39.0–52.0)
Hemoglobin: 11.3 g/dL — ABNORMAL LOW (ref 13.0–17.0)
Lymphocytes Relative: 47.6 % — ABNORMAL HIGH (ref 12.0–46.0)
Lymphs Abs: 1.6 10*3/uL (ref 0.7–4.0)
MCHC: 32.9 g/dL (ref 30.0–36.0)
MCV: 86.4 fl (ref 78.0–100.0)
Monocytes Absolute: 0.3 10*3/uL (ref 0.1–1.0)
Monocytes Relative: 7.7 % (ref 3.0–12.0)
Neutro Abs: 1.3 10*3/uL — ABNORMAL LOW (ref 1.4–7.7)
Neutrophils Relative %: 37.6 % — ABNORMAL LOW (ref 43.0–77.0)
Platelets: 270 10*3/uL (ref 150.0–400.0)
RBC: 3.99 Mil/uL — ABNORMAL LOW (ref 4.22–5.81)
RDW: 13.9 % (ref 11.5–15.5)
WBC: 3.4 10*3/uL — ABNORMAL LOW (ref 4.0–10.5)

## 2022-03-22 LAB — BASIC METABOLIC PANEL
BUN: 16 mg/dL (ref 6–23)
CO2: 31 mEq/L (ref 19–32)
Calcium: 9.5 mg/dL (ref 8.4–10.5)
Chloride: 104 mEq/L (ref 96–112)
Creatinine, Ser: 1.1 mg/dL (ref 0.40–1.50)
GFR: 67.81 mL/min (ref >60.00)
Glucose, Bld: 122 mg/dL — ABNORMAL HIGH (ref 70–99)
Potassium: 4.1 mEq/L (ref 3.5–5.1)
Sodium: 140 mEq/L (ref 135–145)

## 2022-03-22 LAB — HEPATIC FUNCTION PANEL
ALT: 11 U/L (ref 0–53)
AST: 20 U/L (ref 0–37)
Albumin: 4 g/dL (ref 3.5–5.2)
Alkaline Phosphatase: 67 U/L (ref 39–117)
Bilirubin, Direct: 0.1 mg/dL (ref 0.0–0.3)
Total Bilirubin: 0.3 mg/dL (ref 0.2–1.2)
Total Protein: 6.5 g/dL (ref 6.0–8.3)

## 2022-03-22 LAB — FOLATE: Folate: 15.3 ng/mL (ref >5.9)

## 2022-03-22 LAB — TROPONIN I (HIGH SENSITIVITY): High Sens Troponin I: 6 ng/L (ref 2–17)

## 2022-03-22 LAB — CK: Total CK: 251 U/L — ABNORMAL HIGH (ref 7–232)

## 2022-03-22 LAB — MAGNESIUM: Magnesium: 1.6 mg/dL (ref 1.5–2.5)

## 2022-03-22 MED ORDER — MAGNESIUM OXIDE -MG SUPPLEMENT 400 (240 MG) MG PO TABS: 1.0000 | ORAL_TABLET | Freq: Every day | ORAL | 1 refills | Status: DC

## 2022-03-22 MED ORDER — ROSUVASTATIN CALCIUM 5 MG PO TABS: 5.0000 mg | ORAL_TABLET | Freq: Every day | ORAL | 1 refills | Status: DC

## 2022-03-22 NOTE — Progress Notes (Signed)
Subjective:  Patient ID: Keith [name], male    DOB: 10/12/1950  Age: 71 y.o. MRN: [id number]  CC: Anemia, Hyperlipidemia, and Hypertension   HPI Keith [name] presents for f/up -  He complains of chronic, nonproductive cough with dyspnea on exertion.  He complains of weight loss, numbness, tingling, weakness.  He has a jumping sensation in his muscles but he denies myalgias.  Outpatient Medications Prior to Visit  Medication Sig Dispense Refill   Cholecalciferol (VITAMIN D3) 1.25 MG (50000 UT) CAPS TAKE 1 CAPSULE EVERY WEEK 12 capsule 0   doxycycline (ADOXA) 100 MG tablet Take 100 mg by mouth daily.     tamsulosin (FLOMAX) 0.4 MG CAPS capsule Take 0.4 mg by mouth.     MAGNESIUM-OXIDE 400 (240 Mg) MG tablet TAKE 1 TABLET BY MOUTH EVERY DAY 90 tablet 0   rosuvastatin (CRESTOR) 5 MG tablet Take 1 tablet (5 mg total) by mouth daily. 90 tablet 1   No facility-administered medications prior to visit.    ROS Review of Systems  Constitutional:  Positive for unexpected weight change. Negative for diaphoresis and fatigue.  HENT: Negative.    Eyes: Negative.  Negative for visual disturbance.  Respiratory:  Positive for cough and shortness of breath. Negative for chest tightness and wheezing.   Cardiovascular:  Negative for chest pain, palpitations and leg swelling.  Gastrointestinal:  Negative for abdominal pain, constipation, diarrhea and vomiting.  Endocrine: Negative.   Genitourinary: Negative.  Negative for difficulty urinating and dysuria.  Musculoskeletal: Negative.  Negative for myalgias.  Skin: Negative.   Neurological:  Positive for weakness and numbness. Negative for dizziness, light-headedness and headaches.  Hematological:  Negative for adenopathy. Does not bruise/bleed easily.  Psychiatric/Behavioral: Negative.      Objective:  BP 110/70 (BP Location: Right Arm, Patient Position: Sitting, Cuff Size: Large)   Pulse 62   Temp 98.1 F (36.7 C) (Oral)   Ht 5' 5.5"  (1.664 m)   Wt 117 lb (53.1 kg)   SpO2 99%   BMI 19.17 kg/m   BP Readings from Last 3 Encounters:  03/22/22 110/70  06/23/21 103/70  06/22/21 110/68    Wt Readings from Last 3 Encounters:  03/22/22 117 lb (53.1 kg)  06/23/21 124 lb (56.2 kg)  06/22/21 125 lb 12.8 oz (57.1 kg)    Physical Exam Vitals reviewed.  Constitutional:      General: He is not in acute distress.    Appearance: He is ill-appearing. He is not toxic-appearing or diaphoretic.  HENT:     Mouth/Throat:     Mouth: Mucous membranes are moist.  Eyes:     General: No scleral icterus.    Conjunctiva/sclera: Conjunctivae normal.  Cardiovascular:     Rate and Rhythm: Regular rhythm. Bradycardia present.     Comments: EKG-  SB, 57 bpm Septal infarct pattern is old No LVH Pulmonary:     Effort: Pulmonary effort is normal.     Breath sounds: No stridor. No wheezing, rhonchi or rales.  Abdominal:     General: Abdomen is flat.     Palpations: There is no mass.     Tenderness: There is no abdominal tenderness. There is no guarding.     Hernia: No hernia is present.  Musculoskeletal:     Right lower leg: No edema.     Left lower leg: No edema.  Skin:    General: Skin is warm.     Findings: No lesion.  Neurological:     General:  No focal deficit present.     Mental Status: He is alert. Mental status is at baseline.     Motor: Weakness and atrophy present.     Coordination: Coordination abnormal.     Lab Results  Component Value Date   WBC 3.4 (L) 03/22/2022   HGB 11.3 (L) 03/22/2022   HCT 34.4 (L) 03/22/2022   PLT 270.0 03/22/2022   GLUCOSE 122 (H) 03/22/2022   CHOL 112 04/06/2021   TRIG 105.0 04/06/2021   HDL 54.00 04/06/2021   LDLCALC 37 04/06/2021   ALT 11 03/22/2022   AST 20 03/22/2022   NA 140 03/22/2022   K 4.1 03/22/2022   CL 104 03/22/2022   CREATININE 1.10 03/22/2022   BUN 16 03/22/2022   CO2 31 03/22/2022   TSH 2.57 04/06/2021   PSA 3.8 10/24/2021   INR 1.1 ratio (H) 08/25/2009     CT CHEST LCS NODULE F/U W/O CONTRAST  Result Date: 10/11/2021 CLINICAL DATA:  71 year old male current smoker with 23 pack-year history of smoking. Follow-up for prior abnormal low-dose lung cancer screening examination. EXAM: CT CHEST WITHOUT CONTRAST FOR LUNG CANCER SCREENING NODULE FOLLOW-UP TECHNIQUE: Multidetector CT imaging of the chest was performed following the standard protocol without IV contrast. RADIATION DOSE REDUCTION: This exam was performed according to the departmental dose-optimization program which includes automated exposure control, adjustment of the mA and/or kV according to patient size and/or use of iterative reconstruction technique. COMPARISON:  Chest CT 06/02/2021. FINDINGS: Cardiovascular: Heart size is normal. There is no significant pericardial fluid, thickening or pericardial calcification. There is aortic atherosclerosis, as well as atherosclerosis of the great vessels of the mediastinum and the coronary arteries, including calcified atherosclerotic plaque in the right coronary artery. Mediastinum/Nodes: No pathologically enlarged mediastinal or hilar lymph nodes. Esophagus is unremarkable in appearance. No axillary lymphadenopathy. Lungs/Pleura: Previously noted nodule of concern in the right upper lobe near the apex (axial image 60 of series 3) is similar to the prior examination, with a volume derived mean diameter of 7.9 mm on today's study, presumably a benign area of chronic post infectious or inflammatory scarring. Other smaller pulmonary nodules are stable in number and size and considered benign. No other larger more suspicious appearing pulmonary nodules or masses are noted. No acute consolidative airspace disease. No pleural effusions. Mild diffuse bronchial wall thickening with mild centrilobular and paraseptal emphysema. Upper Abdomen: 3 mm nonobstructive calculus in the upper pole collecting system of the left kidney. Atherosclerotic calcifications in the  abdominal aorta. Musculoskeletal: There are no aggressive appearing lytic or blastic lesions noted in the visualized portions of the skeleton. IMPRESSION: 1. Lung-RADS 2S, benign appearance or behavior. Continue annual screening with low-dose chest CT without contrast in 12 months. 2. The "S" modifier above refers to potentially clinically significant non lung cancer related findings. Specifically, there is aortic atherosclerosis, in addition to right coronary artery disease. Please note that although the presence of coronary artery calcium documents the presence of coronary artery disease, the severity of this disease and any potential stenosis cannot be assessed on this non-gated CT examination. Assessment for potential risk factor modification, dietary therapy or pharmacologic therapy may be warranted, if clinically indicated. 3. Mild diffuse bronchial wall thickening with mild centrilobular and paraseptal emphysema; imaging findings suggestive of underlying COPD. 4. 3 mm nonobstructive calculus in the upper pole collecting system of the right kidney. Aortic Atherosclerosis (ICD10-I70.0) and Emphysema (ICD10-J43.9). Electronically Signed   By: Vinnie Langton M.D.   On: 10/11/2021 10:21  Assessment & Plan:   Keith [name] was seen today for anemia, hyperlipidemia and hypertension.  Diagnoses and all orders for this visit:  Primary hypertension- His blood pressure is well controlled. -     Basic metabolic panel; Future -     Hepatic function panel; Future -     CBC with Differential/Platelet; Future -     EKG 12-Lead -     CBC with Differential/Platelet -     Hepatic function panel -     Basic metabolic panel  Hypomagnesemia syndrome- I have asked him to be more compliant with the magnesium supplement. -     MAGnesium-Oxide 400 (240 Mg) MG tablet; Take 1 tablet (400 mg total) by mouth daily. -     Basic metabolic panel; Future -     Magnesium; Future -     Magnesium -     Basic metabolic  panel  Dyslipidemia, goal LDL below 100- LDL goal achieved. Doing well on the statin  -     rosuvastatin (CRESTOR) 5 MG tablet; Take 1 tablet (5 mg total) by mouth daily. -     CK; Future -     CK  DOE (dyspnea on exertion)- His labs and EKG are reassuring. -     Troponin I (High Sensitivity); Future -     Hepatic function panel; Future -     EKG 12-Lead -     Hepatic function panel -     Troponin I (High Sensitivity)  Deficiency anemia- Will evaluate for vitamin deficiencies. -     Vitamin B12; Future -     IBC + Ferritin; Future -     Vitamin B1; Future -     Zinc; Future -     CBC with Differential/Platelet; Future -     Folate; Future -     Reticulocytes; Future -     Reticulocytes -     Folate -     CBC with Differential/Platelet -     Zinc -     Vitamin B1 -     IBC + Ferritin -     Vitamin B12  Mucopurulent chronic bronchitis (HCC) -     Ambulatory referral to Pulmonology   I have changed Drayson Sweda's MAGnesium-Oxide to magnesium oxide. I am also having him maintain his tamsulosin, doxycycline, Vitamin D3, and rosuvastatin.  Meds ordered this encounter  Medications   MAGnesium-Oxide 400 (240 Mg) MG tablet    Sig: Take 1 tablet (400 mg total) by mouth daily.    Dispense:  90 tablet    Refill:  1   rosuvastatin (CRESTOR) 5 MG tablet    Sig: Take 1 tablet (5 mg total) by mouth daily.    Dispense:  90 tablet    Refill:  1     Follow-up: Return in about 3 months (around 06/22/2022).  Scarlette Calico, MD

## 2022-03-22 NOTE — Patient Instructions (Signed)
Anemia  Anemia is a condition in which there is not enough red blood cells or hemoglobin in the blood. Hemoglobin is a substance in red blood cells that carries oxygen. When you do not have enough red blood cells or hemoglobin (are anemic), your body cannot get enough oxygen and your organs may not work properly. As a result, you may feel very tired or have other problems. What are the causes? Common causes of anemia include: Excessive bleeding. Anemia can be caused by excessive bleeding inside or outside the body, including bleeding from the intestines or from heavy menstrual periods in females. Poor nutrition. Long-lasting (chronic) kidney, thyroid, and liver disease. Bone marrow disorders, spleen problems, and blood disorders. Cancer and treatments for cancer. HIV (human immunodeficiency virus) and AIDS (acquired immunodeficiency syndrome). Infections, medicines, and autoimmune disorders that destroy red blood cells. What are the signs or symptoms? Symptoms of this condition include: Minor weakness. Dizziness. Headache, or difficulties concentrating and sleeping. Heartbeats that feel irregular or faster than normal (palpitations). Shortness of breath, especially with exercise. Pale skin, lips, and nails, or cold hands and feet. Indigestion and nausea. Symptoms may occur suddenly or develop slowly. If your anemia is mild, you may not have symptoms. How is this diagnosed? This condition is diagnosed based on blood tests, your medical history, and a physical exam. In some cases, a test may be needed in which cells are removed from the soft tissue inside of a bone and looked at under a microscope (bone marrow biopsy). Your health care provider may also check your stool (feces) for blood and may do additional testing to look for the cause of your bleeding. Other tests may include: Imaging tests, such as a CT scan or MRI. A procedure to see inside your esophagus and stomach (endoscopy). A  procedure to see inside your colon and rectum (colonoscopy). How is this treated? Treatment for this condition depends on the cause. If you continue to lose a lot of blood, you may need to be treated at a hospital. Treatment may include: Taking supplements of iron, vitamin B12, or folic acid. Taking a hormone medicine (erythropoietin) that can help to stimulate red blood cell growth. Having a blood transfusion. This may be needed if you lose a lot of blood. Making changes to your diet. Having surgery to remove your spleen. Follow these instructions at home: Take over-the-counter and prescription medicines only as told by your health care provider. Take supplements only as told by your health care provider. Follow any diet instructions that you were given by your health care provider. Keep all follow-up visits as told by your health care provider. This is important. Contact a health care provider if: You develop new bleeding anywhere in the body. Get help right away if: You are very weak. You are short of breath. You have pain in your abdomen or chest. You are dizzy or feel faint. You have trouble concentrating. You have bloody stools, black stools, or tarry stools. You vomit repeatedly or you vomit up blood. These symptoms may represent a serious problem that is an emergency. Do not wait to see if the symptoms will go away. Get medical help right away. Call your local emergency services (911 in the [place] Do not drive yourself to the hospital. Summary Anemia is a condition in which you do not have enough red blood cells or enough of a substance in your red blood cells that carries oxygen (hemoglobin). Symptoms may occur suddenly or develop slowly. If your anemia   is mild, you may not have symptoms. This condition is diagnosed with blood tests, a medical history, and a physical exam. Other tests may be needed. Treatment for this condition depends on the cause of the anemia. This  information is not intended to replace advice given to you by your health care provider. Make sure you discuss any questions you have with your health care provider. Document Revised: 07/18/2021 Document Reviewed: 08/11/2019 Elsevier Patient Education  2023 [employer] Inc.  

## 2022-03-27 LAB — VITAMIN B1: Vitamin B1 (Thiamine): 19 nmol/L (ref 8–30)

## 2022-03-27 LAB — RETICULOCYTES
ABS Retic: 52130 cells/uL (ref 25000–90000)
Retic Ct Pct: 1.3 %

## 2022-03-27 LAB — ZINC: Zinc: 60 ug/dL (ref 60–130)

## 2022-03-30 ENCOUNTER — Other Ambulatory Visit: Payer: Self-pay | Admitting: Internal Medicine

## 2022-03-30 ENCOUNTER — Encounter: Payer: Self-pay | Admitting: Internal Medicine

## 2022-03-30 DIAGNOSIS — D538 Other specified nutritional anemias: Secondary | ICD-10-CM

## 2022-03-30 MED ORDER — ZINC GLUCONATE 50 MG PO TABS: 50.0000 mg | ORAL_TABLET | Freq: Every day | ORAL | 1 refills | Status: DC

## 2022-04-24 ENCOUNTER — Ambulatory Visit: Payer: Medicare HMO | Admitting: Pulmonary Disease

## 2022-04-24 ENCOUNTER — Encounter: Payer: Self-pay | Admitting: Pulmonary Disease

## 2022-04-24 VITALS — BP 118/66 | HR 56 | Ht 65.5 in | Wt 116.4 lb

## 2022-04-24 DIAGNOSIS — F17210 Nicotine dependence, cigarettes, uncomplicated: Secondary | ICD-10-CM

## 2022-04-24 DIAGNOSIS — R053 Chronic cough: Secondary | ICD-10-CM | POA: Diagnosis not present

## 2022-04-24 DIAGNOSIS — J432 Centrilobular emphysema: Secondary | ICD-10-CM | POA: Diagnosis not present

## 2022-04-24 MED ORDER — FLUTICASONE FUROATE-VILANTEROL 100-25 MCG/ACT IN AEPB: 1.0000 | INHALATION_SPRAY | Freq: Every day | RESPIRATORY_TRACT | 0 refills | Status: DC

## 2022-04-24 NOTE — Addendum Note (Signed)
Addended by: Valerie Salts on: 04/24/2022 02:26 PM   Modules accepted: Orders

## 2022-04-24 NOTE — Patient Instructions (Signed)
Try breo ellipta 1 puff daily and let us know if you have improvement in your cough and shortness of breath - rinse mouth out after each use  Follow up in 1 month with pulmonary function tests.

## 2022-04-24 NOTE — Progress Notes (Signed)
Synopsis: Referred in August 2023 for mucopurulent bronchitis by Scarlette Calico, MD  Subjective:   PATIENT ID: Keith [name] GENDER: male DOB: 08-02-1951, MRN: [id number]  HPI  Chief Complaint  Patient presents with   Consult    Referred by PCP for increase SOB and cough for the past few months.    Keith [name] is a 70 year old male, daily smoker with hypertension who is referred to pulmonary clinic for COPD.   He has centrilobular emphysema noted on lung cancer screening CT Chest 09/2021 along with a 7.77m LUL pulmonary nodule that is stable from 05/2021.   He reports cough over the past 2 years that is occasionally productive of yellowish white sputum. He denies wheezing. He has exertional dyspnea with strenuous activity otherwise denies dyspnea when walking through a store. He is active doing yardwork at his home.   He has been smoking for 40 years. He is currently smoking 2-3 cigarettes per day. He is a retired bHorticulturist, commercial He is currently undergoing lung cancer screening by his primary care team. He lives with his wife.    Past Medical History:  Diagnosis Date   Chicken pox    [place]    patient denies   Hyperlipidemia    on meds   Hypertension      Family History  Problem Relation Age of Onset   Hypertension Mother    Heart attack Mother        No details   Hypertension Father    Heart attack Father        No details   Heart attack Sister        No details   Pancreatic cancer Brother    Alcohol abuse Brother    Alcohol abuse Brother    Hypertension Brother    Lung cancer Brother    Colon polyps Neg Hx    Colon cancer Neg Hx    Esophageal cancer Neg Hx    Stomach cancer Neg Hx    Rectal cancer Neg Hx      Social History   Socioeconomic History   Marital status: Married    Spouse name: Not on file   Number of children: 2   Years of education: Not on file   Highest education level: Not on file   Occupational History   Not on file  Tobacco Use   Smoking status: Every Day    Packs/day: 0.50    Years: 40.00    Total pack years: 20.00    Types: Cigarettes    Start date: 02/15/1969   Smokeless tobacco: Never   Tobacco comments:    Smokes about 3-4 cigarettes per day  Vaping Use   Vaping Use: Never used  Substance and Sexual Activity   Alcohol use: Not Currently    Alcohol/week: 2.0 standard drinks of alcohol    Types: 2 Standard drinks or equivalent per week    Comment: Patient quit drinking 03/2021   Drug use: Never   Sexual activity: Not Currently    Partners: Female  Other Topics Concern   Not on file  Social History Narrative   Lives with wife, retired.  2 children and one grand.     Lives in a one story    Right Handed, but patient can use left    Social Determinants of Health   Financial Resource Strain: Low Risk  (04/28/2021)   Overall Financial Resource Strain (CARDIA)    Difficulty of  Paying Living Expenses: Not very hard  Food Insecurity: Not on file  Transportation Needs: Not on file  Physical Activity: Not on file  Stress: Not on file  Social Connections: Not on file  Intimate Partner Violence: Not on file     No Known Allergies   Outpatient Medications Prior to Visit  Medication Sig Dispense Refill   Cholecalciferol (VITAMIN D3) 1.25 MG (50000 UT) CAPS TAKE 1 CAPSULE EVERY WEEK 12 capsule 0   doxycycline (ADOXA) 100 MG tablet Take 100 mg by mouth daily.     MAGnesium-Oxide 400 (240 Mg) MG tablet Take 1 tablet (400 mg total) by mouth daily. 90 tablet 1   rosuvastatin (CRESTOR) 5 MG tablet Take 1 tablet (5 mg total) by mouth daily. 90 tablet 1   tamsulosin (FLOMAX) 0.4 MG CAPS capsule Take 0.4 mg by mouth.     zinc gluconate 50 MG tablet Take 1 tablet (50 mg total) by mouth daily. 90 tablet 1   No facility-administered medications prior to visit.    Review of Systems  Constitutional:  Negative for chills, fever, malaise/fatigue and weight loss.   HENT:  Negative for congestion, sinus pain and sore throat.   Eyes: Negative.   Respiratory:  Positive for cough and shortness of breath. Negative for hemoptysis, sputum production and wheezing.   Cardiovascular:  Negative for chest pain, palpitations, orthopnea, claudication and leg swelling.  Gastrointestinal:  Negative for abdominal pain, heartburn, nausea and vomiting.  Genitourinary: Negative.   Musculoskeletal:  Negative for joint pain and myalgias.  Skin:  Negative for rash.  Neurological:  Negative for weakness.  Endo/Heme/Allergies: Negative.   Psychiatric/Behavioral: Negative.     Objective:   Vitals:   04/24/22 1336  BP: 118/66  Pulse: (!) 56  SpO2: 100%  Weight: 116 lb 6.4 oz (52.8 kg)  Height: 5' 5.5" (1.664 m)   Physical Exam Constitutional:      General: He is not in acute distress. HENT:     Head: Normocephalic and atraumatic.  Eyes:     Extraocular Movements: Extraocular movements intact.     Conjunctiva/sclera: Conjunctivae normal.     Pupils: Pupils are equal, round, and reactive to light.  Cardiovascular:     Rate and Rhythm: Normal rate and regular rhythm.     Pulses: Normal pulses.     Heart sounds: Normal heart sounds. No murmur heard. Pulmonary:     Effort: Pulmonary effort is normal.     Breath sounds: Normal breath sounds.  Abdominal:     General: Bowel sounds are normal.     Palpations: Abdomen is soft.  Musculoskeletal:     Right lower leg: No edema.     Left lower leg: No edema.  Lymphadenopathy:     Cervical: No cervical adenopathy.  Skin:    General: Skin is warm and dry.  Neurological:     General: No focal deficit present.     Mental Status: He is alert.  Psychiatric:        Mood and Affect: Mood normal.        Behavior: Behavior normal.        Thought Content: Thought content normal.        Judgment: Judgment normal.     CBC    Component Value Date/Time   WBC 3.4 (L) 03/22/2022 0937   RBC 3.99 (L) 03/22/2022 0937    HGB 11.3 (L) 03/22/2022 0937   HCT 34.4 (L) 03/22/2022 0937   PLT 270.0 03/22/2022 3810  MCV 86.4 03/22/2022 0937   MCHC 32.9 03/22/2022 0937   RDW 13.9 03/22/2022 0937   LYMPHSABS 1.6 03/22/2022 0937   MONOABS 0.3 03/22/2022 0937   EOSABS 0.2 03/22/2022 0937   BASOSABS 0.0 03/22/2022 0937      Latest Ref Rng & Units 03/22/2022    9:37 AM 06/07/2021    3:55 PM 04/06/2021    2:42 PM  BMP  Glucose 70 - 99 mg/dL 122  87  79   BUN 6 - 23 mg/dL '16  17  12   '$ Creatinine 0.40 - 1.50 mg/dL 1.10  1.09  0.96   Sodium 135 - 145 mEq/L 140  139  141   Potassium 3.5 - 5.1 mEq/L 4.1  4.1  4.4   Chloride 96 - 112 mEq/L 104  102  103   CO2 19 - 32 mEq/L '31  31  29   '$ Calcium 8.4 - 10.5 mg/dL 9.5  9.6  9.6    Chest imaging: LCS CT Chest 10/10/21 - Mild diffuse bronchial wall thickening with mild centrilobular and paraseptal emphysema; imaging findings suggestive of underlying COPD. - Lung RADS 2S - stable 7.32m RUL nodule from 06/02/21  PFT:     No data to display          Labs:  Path:  Echo:  Heart Catheterization:  Assessment & Plan:   Centrilobular emphysema (HCC)  Chronic cough - Plan: Pulmonary Function Test  Cigarette smoker  Discussion: DJaceion Adayis a 71year old male, daily smoker with hypertension who is referred to pulmonary clinic for COPD.   He has centrilobular emphysema and diffuse bronchial wall thickening on CT Chest from 09/2021. 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   We reviewed CT Chest scan together and discussed his emphysema in relation to his smoking. I have recommended he quit smoking to not contribute to further destruction of his lungs.   We will trial him on Breo ellipta 1 puff daily to see if this helps with his cough.  Follow up in 1 month with pulmonary function tests.   JFreda Jackson MD [facility] Office: [phone]  Current Outpatient Medications:     Cholecalciferol (VITAMIN D3) 1.25 MG (50000 UT) CAPS, TAKE 1 CAPSULE EVERY WEEK, Disp: 12 capsule, Rfl: 0   doxycycline (ADOXA) 100 MG tablet, Take 100 mg by mouth daily., Disp: , Rfl:    MAGnesium-Oxide 400 (240 Mg) MG tablet, Take 1 tablet (400 mg total) by mouth daily., Disp: 90 tablet, Rfl: 1   rosuvastatin (CRESTOR) 5 MG tablet, Take 1 tablet (5 mg total) by mouth daily., Disp: 90 tablet, Rfl: 1   tamsulosin (FLOMAX) 0.4 MG CAPS capsule, Take 0.4 mg by mouth., Disp: , Rfl:    zinc gluconate 50 MG tablet, Take 1 tablet (50 mg total) by mouth daily., Disp: 90 tablet, Rfl: 1

## 2022-05-10 ENCOUNTER — Other Ambulatory Visit: Payer: Self-pay | Admitting: Internal Medicine

## 2022-05-10 DIAGNOSIS — E559 Vitamin D deficiency, unspecified: Secondary | ICD-10-CM

## 2022-05-25 ENCOUNTER — Ambulatory Visit: Payer: Medicare HMO | Admitting: Pulmonary Disease

## 2022-05-31 DIAGNOSIS — R972 Elevated prostate specific antigen [PSA]: Secondary | ICD-10-CM | POA: Diagnosis not present

## 2022-05-31 DIAGNOSIS — N401 Enlarged prostate with lower urinary tract symptoms: Secondary | ICD-10-CM | POA: Diagnosis not present

## 2022-05-31 DIAGNOSIS — R350 Frequency of micturition: Secondary | ICD-10-CM | POA: Diagnosis not present

## 2022-06-28 ENCOUNTER — Other Ambulatory Visit: Payer: Self-pay

## 2022-06-28 ENCOUNTER — Other Ambulatory Visit: Payer: Self-pay | Admitting: *Deleted

## 2022-06-28 ENCOUNTER — Ambulatory Visit (INDEPENDENT_AMBULATORY_CARE_PROVIDER_SITE_OTHER): Payer: Medicare HMO | Admitting: Pulmonary Disease

## 2022-06-28 DIAGNOSIS — R053 Chronic cough: Secondary | ICD-10-CM | POA: Diagnosis not present

## 2022-06-28 LAB — PULMONARY FUNCTION TEST
DL/VA % pred: 71 %
DL/VA: 2.94 ml/min/mmHg/L
DLCO cor % pred: 65 %
DLCO cor: 14.07 ml/min/mmHg
DLCO unc % pred: 65 %
DLCO unc: 14.07 ml/min/mmHg
FEF 25-75 Post: 1.67 L/sec
FEF 25-75 Pre: 1.47 L/sec
FEF2575-%Change-Post: 13 %
FEF2575-%Pred-Post: 86 %
FEF2575-%Pred-Pre: 76 %
FEV1-%Change-Post: 5 %
FEV1-%Pred-Post: 85 %
FEV1-%Pred-Pre: 80 %
FEV1-Post: 2.18 L
FEV1-Pre: 2.06 L
FEV1FVC-%Change-Post: 4 %
FEV1FVC-%Pred-Pre: 99 %
FEV6-%Change-Post: 1 %
FEV6-%Pred-Post: 87 %
FEV6-%Pred-Pre: 85 %
FEV6-Post: 2.86 L
FEV6-Pre: 2.81 L
FEV6FVC-%Pred-Post: 107 %
FEV6FVC-%Pred-Pre: 107 %
FVC-%Change-Post: 1 %
FVC-%Pred-Post: 81 %
FVC-%Pred-Pre: 80 %
FVC-Post: 2.86 L
FVC-Pre: 2.81 L
Post FEV1/FVC ratio: 76 %
Post FEV6/FVC ratio: 100 %
Pre FEV1/FVC ratio: 73 %
Pre FEV6/FVC Ratio: 100 %
RV % pred: 109 %
RV: 2.38 L
TLC % pred: 86 %
TLC: 5.1 L

## 2022-06-28 MED ORDER — FLUTICASONE FUROATE-VILANTEROL 100-25 MCG/ACT IN AEPB: 1.0000 | INHALATION_SPRAY | Freq: Every day | RESPIRATORY_TRACT | 3 refills | Status: DC

## 2022-06-28 NOTE — Progress Notes (Signed)
PFT done today. 

## 2022-06-29 ENCOUNTER — Telehealth: Payer: Self-pay | Admitting: Pulmonary Disease

## 2022-06-29 NOTE — Telephone Encounter (Signed)
Left message for patient to call back  

## 2022-07-10 ENCOUNTER — Ambulatory Visit (INDEPENDENT_AMBULATORY_CARE_PROVIDER_SITE_OTHER): Payer: Medicare HMO | Admitting: Internal Medicine

## 2022-07-10 ENCOUNTER — Encounter: Payer: Self-pay | Admitting: Internal Medicine

## 2022-07-10 ENCOUNTER — Ambulatory Visit: Payer: Medicare HMO | Admitting: Pulmonary Disease

## 2022-07-10 VITALS — BP 110/70 | HR 56 | Temp 98.2°F | Ht 65.5 in | Wt 117.4 lb

## 2022-07-10 DIAGNOSIS — J432 Centrilobular emphysema: Secondary | ICD-10-CM

## 2022-07-10 DIAGNOSIS — I739 Peripheral vascular disease, unspecified: Secondary | ICD-10-CM | POA: Diagnosis not present

## 2022-07-10 DIAGNOSIS — F17210 Nicotine dependence, cigarettes, uncomplicated: Secondary | ICD-10-CM | POA: Diagnosis not present

## 2022-07-10 MED ORDER — SPIRIVA RESPIMAT 2.5 MCG/ACT IN AERS: 2.0000 | INHALATION_SPRAY | Freq: Every day | RESPIRATORY_TRACT | 5 refills | Status: DC

## 2022-07-10 MED ORDER — SPIRIVA RESPIMAT 2.5 MCG/ACT IN AERS: 2.0000 | INHALATION_SPRAY | Freq: Every day | RESPIRATORY_TRACT | 0 refills | Status: DC

## 2022-07-10 NOTE — Patient Instructions (Addendum)
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 Erin Fulling

## 2022-07-10 NOTE — Progress Notes (Signed)
OV 07/10/2022  Subjective:  Patient ID: Keith [name], male , DOB: [birth year] , age 71 y.o. , MRN: [id number] , ADDRESS: [place] [id number] PCP Janith Lima, MD Patient Care Team: Janith Lima, MD as PCP - General (Internal Medicine) Alda Berthold, DO as Consulting Physician (Neurology) [place], Darnelle Maffucci, [facility] (Inactive) as Pharmacist (Pharmacist)  This Provider for this visit: Treatment Team:  Attending Provider: Freddi Starr, MD    07/10/2022 -   Chief Complaint  Patient presents with   Follow-up    Follow-up for PFT, breathing better per PT     HPI Keith [name] 71 y.o. -returns for follow-up to see how Memory [facility] is working for him and with pulmonary function testing.  He was supposed to see Dr. Erin Fulling but he has been working with me because Dr. Dewe;d is not available today.  His pulmonary function test shows isolated reduction in DLCO.  He says he is not sure if the Memory Dance is helping him.  He thinks it might be helping him.  He says when he exerts he is more bothered by what he describes and sounds to me like claudication which I did tell him to go talk to primary care physician.  He continues to smoke a little bit he says.  Otherwise no issues.  He is unable to for the [facility] because of expense.  In any event he seems to have mild emphysema based on isolated reduction in diffusion capacity.    CT Chest data  No results found.    PFT     Latest Ref Rng & Units 06/28/2022    3:53 PM  PFT Results  FVC-Pre L 2.81  P  FVC-Predicted Pre % 80  P  FVC-Post L 2.86  P  FVC-Predicted Post % 81  P  Pre FEV1/FVC % % 73  P  Post FEV1/FCV % % 76  P  FEV1-Pre L 2.06  P  FEV1-Predicted Pre % 80  P  FEV1-Post L 2.18  P  DLCO uncorrected ml/min/mmHg 14.07  P  DLCO UNC% % 65  P  DLCO corrected ml/min/mmHg 14.07  P  DLCO COR %Predicted % 65  P  DLVA Predicted % 71  P  TLC L 5.10  P  TLC % Predicted % 86  P  RV % Predicted % 109  P     P Preliminary result       has a past medical history of Chicken pox, COPD (chronic obstructive pulmonary disease) ([place]), Hyperlipidemia, and Hypertension.   reports that he quit smoking 6 days ago. His smoking use included cigarettes. He started smoking about 53 years ago. He has a 26.50 pack-year smoking history. He has never used smokeless tobacco.  Past Surgical History:  Procedure Laterality Date   COLONOSCOPY  2009   Dr.Orr-F/V-prep unknown-tortuous sigm colon-    No Known Allergies  Immunization History  Administered Date(s) Administered   Fluad Quad(high Dose 65+) 06/07/2021   PFIZER(Purple Top)SARS-COV-2 Vaccination 10/23/2019, 11/13/2019, 06/27/2020   PNEUMOCOCCAL CONJUGATE-20 04/06/2021    Family History  Problem Relation Age of Onset   Hypertension Mother    Heart attack Mother        No details   Hypertension Father    Heart attack Father        No details   Heart attack Sister        No details   Pancreatic cancer Brother    Alcohol  abuse Brother    Alcohol abuse Brother    Hypertension Brother    Lung cancer Brother    Colon polyps Neg Hx    Colon cancer Neg Hx    Esophageal cancer Neg Hx    Stomach cancer Neg Hx    Rectal cancer Neg Hx      Current Outpatient Medications:    Tiotropium Bromide Monohydrate (SPIRIVA RESPIMAT) 2.5 MCG/ACT AERS, Inhale 2 puffs into the lungs daily., Disp: 4 g, Rfl: 0   Cholecalciferol (VITAMIN D3) 1.25 MG (50000 UT) CAPS, TAKE 1 CAPSULE EVERY WEEK, Disp: 12 capsule, Rfl: 0   doxycycline (ADOXA) 100 MG tablet, Take 100 mg by mouth daily., Disp: , Rfl:    fluticasone furoate-vilanterol (BREO ELLIPTA) 100-25 MCG/ACT AEPB, Inhale 1 puff into the lungs daily., Disp: 30 each, Rfl: 3   MAGnesium-Oxide 400 (240 Mg) MG tablet, Take 1 tablet (400 mg total) by mouth daily., Disp: 90 tablet, Rfl: 1   rosuvastatin (CRESTOR) 5 MG tablet, Take 1 tablet (5 mg total) by mouth daily., Disp: 90 tablet, Rfl: 1   tamsulosin (FLOMAX)  0.4 MG CAPS capsule, Take 0.4 mg by mouth., Disp: , Rfl:    Tiotropium Bromide Monohydrate (SPIRIVA RESPIMAT) 2.5 MCG/ACT AERS, Inhale 2 puffs into the lungs daily., Disp: 4 g, Rfl: 5   zinc gluconate 50 MG tablet, Take 1 tablet (50 mg total) by mouth daily., Disp: 90 tablet, Rfl: 1      Objective:   Vitals:   07/10/22 1306  BP: 110/70  Pulse: (!) 56  Temp: 98.2 F (36.8 C)  TempSrc: Oral  SpO2: 100%  Weight: 117 lb 6.4 oz (53.3 kg)  Height: 5' 5.5" (1.664 m)    Estimated body mass index is 19.24 kg/m as calculated from the following:   Height as of this encounter: 5' 5.5" (1.664 m).   Weight as of this encounter: 117 lb 6.4 oz (53.3 kg).  '@WEIGHTCHANGE'$ @  Filed Weights   07/10/22 1306  Weight: 117 lb 6.4 oz (53.3 kg)    General: No distress.  Deconditioned looking male with a cane. Neuro: Alert and Oriented x 3. GCS 15. Speech normal Psych: Pleasant Resp:  Barrel Chest -mild barrel chest yes.  Wheeze -no, Crackles -no, No overt respiratory distress CVS: Normal heart sounds. Murmurs -no Ext: Stigmata of Connective Tissue Disease -no but has cane HEENT: Normal upper airway. PEERL +. No post nasal drip        Assessment:       ICD-10-CM   1. Centrilobular emphysema ([place])  J43.2     2. Cigarette smoker  F17.210     3. 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 Johnette Abraham    Dr. Brand Males, M.D., F.C.C.P,  Pulmonary and Critical Care Medicine Staff Physician, [place] Director - Interstitial Lung Disease  Program   P[facility] [place], [place], [address]  Pager: [phone], If no answer or between  15:00h - 7:00h: call [phone] Telephone: [phone]  1:23 PM 07/10/2022

## 2022-07-18 NOTE — Telephone Encounter (Signed)
Patient was seen by MR on 10/24 and was switched to Spiriva 2.34mg. Will close this encounter.

## 2022-07-23 DIAGNOSIS — Z23 Encounter for immunization: Secondary | ICD-10-CM | POA: Diagnosis not present

## 2022-07-23 DIAGNOSIS — L299 Pruritus, unspecified: Secondary | ICD-10-CM | POA: Diagnosis not present

## 2022-07-23 DIAGNOSIS — L700 Acne vulgaris: Secondary | ICD-10-CM | POA: Diagnosis not present

## 2022-07-24 ENCOUNTER — Ambulatory Visit (INDEPENDENT_AMBULATORY_CARE_PROVIDER_SITE_OTHER): Payer: Medicare HMO | Admitting: Internal Medicine

## 2022-07-24 ENCOUNTER — Ambulatory Visit (INDEPENDENT_AMBULATORY_CARE_PROVIDER_SITE_OTHER): Payer: Medicare HMO

## 2022-07-24 VITALS — BP 112/66 | HR 69 | Temp 97.9°F | Ht 65.5 in | Wt 121.0 lb

## 2022-07-24 DIAGNOSIS — M25572 Pain in left ankle and joints of left foot: Secondary | ICD-10-CM | POA: Diagnosis not present

## 2022-07-24 DIAGNOSIS — M25571 Pain in right ankle and joints of right foot: Secondary | ICD-10-CM | POA: Insufficient documentation

## 2022-07-24 DIAGNOSIS — E559 Vitamin D deficiency, unspecified: Secondary | ICD-10-CM | POA: Diagnosis not present

## 2022-07-24 DIAGNOSIS — M7989 Other specified soft tissue disorders: Secondary | ICD-10-CM | POA: Diagnosis not present

## 2022-07-24 DIAGNOSIS — E538 Deficiency of other specified B group vitamins: Secondary | ICD-10-CM | POA: Diagnosis not present

## 2022-07-24 DIAGNOSIS — R252 Cramp and spasm: Secondary | ICD-10-CM

## 2022-07-24 MED ORDER — CYCLOBENZAPRINE HCL 5 MG PO TABS: 5.0000 mg | ORAL_TABLET | Freq: Three times a day (TID) | ORAL | 1 refills | Status: DC | PRN

## 2022-07-24 NOTE — Patient Instructions (Addendum)
Please take all new medication as prescribed - the muscle relaxer as needed  Ok to use also the OTC Voltaren gel for the right ankle as needed for pain  Please continue all other medications as before, and refills have been done if requested.  Please have the pharmacy call with any other refills you may need.  Please keep your appointments with your specialists as you may have planned  Please go to the XRAY Department in the first floor for the x-ray testing  Please go to the LAB at the blood drawing area for the tests to be done  You will be contacted by phone if any changes need to be made immediately.  Otherwise, you will receive a letter about your results with an explanation, but please check with MyChart first.  Please remember to sign up for MyChart if you have not done so, as this will be important to you in the future with finding out test results, communicating by private email, and scheduling acute appointments online when needed.

## 2022-07-24 NOTE — Assessment & Plan Note (Signed)
Last vitamin D Lab Results  Component Value Date   VD25OH 14.23 (L) 04/06/2021   Low, to start oral replacement

## 2022-07-24 NOTE — Progress Notes (Signed)
Patient ID: Keith [name], male   DOB: November 26, 1950, 71 y.o.   MRN: [id number]        Chief Complaint: follow up muscle spasms and legs jerking. Right ankle pain more than left with swelling, low vit d, elevated psa       HPI:  Keith [name] is a 71 y.o. male here with c/o 3 mo persistent issue with muscle spasms, not really cramping, with legs jerking at times, but no low back pain or other LE radicular symptoms.   Does have right > left ankle pain with right swelling mild intermittent, worse later in the day after walking, Pt denies chest pain, increased sob or doe, wheezing, orthopnea, PND, increased LE swelling, palpitations, dizziness or syncope.   Pt denies polydipsia, polyuria, or new focal neuro s/s.    Pt denies fever, wt loss, night sweats, loss of appetite, or other constitutional symptoms  Has prostate biopsy soon, wanting to be checked today prior to the procedure.  Not taking Vit D   Wt Readings from Last 3 Encounters:  07/24/22 121 lb (54.9 kg)  07/10/22 117 lb 6.4 oz (53.3 kg)  04/24/22 116 lb 6.4 oz (52.8 kg)   BP Readings from Last 3 Encounters:  07/24/22 112/66  07/10/22 110/70  04/24/22 118/66         Past Medical History:  Diagnosis Date   Chicken pox    COPD (chronic obstructive pulmonary disease) ([place])    patient denies   Hyperlipidemia    on meds   Hypertension    Past Surgical History:  Procedure Laterality Date   COLONOSCOPY  2009   Dr.Orr-F/V-prep unknown-tortuous sigm colon-    reports that he quit smoking about 2 weeks ago. His smoking use included cigarettes. He started smoking about 53 years ago. He has a 26.50 pack-year smoking history. He has never used smokeless tobacco. He reports that he does not currently use alcohol after a past usage of about 2.0 standard drinks of alcohol per week. He reports that he does not use drugs. family history includes Alcohol abuse in his brother and brother; Heart attack in his father, mother, and sister;  Hypertension in his brother, father, and mother; Lung cancer in his brother; Pancreatic cancer in his brother. No Known Allergies Current Outpatient Medications on File Prior to Visit  Medication Sig Dispense Refill   Cholecalciferol (VITAMIN D3) 1.25 MG (50000 UT) CAPS TAKE 1 CAPSULE EVERY WEEK 12 capsule 0   fluticasone furoate-vilanterol (BREO ELLIPTA) 100-25 MCG/ACT AEPB Inhale 1 puff into the lungs daily. 30 each 3   levofloxacin (LEVAQUIN) 750 MG tablet Take 750 mg by mouth every morning.     MAGnesium-Oxide 400 (240 Mg) MG tablet Take 1 tablet (400 mg total) by mouth daily. 90 tablet 1   rosuvastatin (CRESTOR) 5 MG tablet Take 1 tablet (5 mg total) by mouth daily. 90 tablet 1   tamsulosin (FLOMAX) 0.4 MG CAPS capsule Take 0.4 mg by mouth.     Tiotropium Bromide Monohydrate (SPIRIVA RESPIMAT) 2.5 MCG/ACT AERS Inhale 2 puffs into the lungs daily. 4 g 0   Tiotropium Bromide Monohydrate (SPIRIVA RESPIMAT) 2.5 MCG/ACT AERS Inhale 2 puffs into the lungs daily. 4 g 5   zinc gluconate 50 MG tablet Take 1 tablet (50 mg total) by mouth daily. 90 tablet 1   Multiple Vitamins-Minerals (SENTRY ADULT PO) Sentry     Tadalafil 2.5 MG TABS Tadalafil     No current facility-administered medications on file prior to visit.  ROS:  All others reviewed and negative.  Objective        PE:  BP 112/66 (BP Location: Left Arm, Patient Position: Sitting, Cuff Size: Large)   Pulse 69   Temp 97.9 F (36.6 C) (Oral)   Ht 5' 5.5" (1.664 m)   Wt 121 lb (54.9 kg)   SpO2 97%   BMI 19.83 kg/m                 Constitutional: Pt appears in NAD, thin for size               HENT: Head: NCAT.                Right Ear: External ear normal.                 Left Ear: External ear normal.                Eyes: . Pupils are equal, round, and reactive to light. Conjunctivae and EOM are normal               Nose: without d/c or deformity               Neck: Neck supple. Gross normal ROM                Cardiovascular: Normal rate and regular rhythm.                 Pulmonary/Chest: Effort normal and breath sounds without rales or wheezing.                Abd:  Soft, NT, ND, + BS, no organomegaly               Neurological: Pt is alert. At baseline orientation, motor grossly intact;  right ankle with moderate degenerative changes               Skin: Skin is warm. No rashes, no other new lesions, LE edema - none               Psychiatric: Pt behavior is normal without agitation   Micro: none  Cardiac tracings I have personally interpreted today:  none  Pertinent Radiological findings (summarize): none   Lab Results  Component Value Date   WBC 3.4 (L) 03/22/2022   HGB 11.3 (L) 03/22/2022   HCT 34.4 (L) 03/22/2022   PLT 270.0 03/22/2022   GLUCOSE 122 (H) 03/22/2022   CHOL 112 04/06/2021   TRIG 105.0 04/06/2021   HDL 54.00 04/06/2021   LDLCALC 37 04/06/2021   ALT 11 03/22/2022   AST 20 03/22/2022   NA 140 03/22/2022   K 4.1 03/22/2022   CL 104 03/22/2022   CREATININE 1.10 03/22/2022   BUN 16 03/22/2022   CO2 31 03/22/2022   TSH 2.57 04/06/2021   PSA 3.8 10/24/2021   INR 1.1 ratio (H) 08/25/2009   Assessment/Plan:  Keith [name] is a 71 y.o. Black or African American [2] male with  has a past medical history of Chicken pox, COPD (chronic obstructive pulmonary disease) ([place]), Hyperlipidemia, and Hypertension.  Vitamin D deficiency disease Last vitamin D Lab Results  Component Value Date   VD25OH 14.23 (L) 04/06/2021   Low, to start oral replacement   Right ankle pain Exam c/w arthritis likely, for xray left and right ankles, volt gel prn topical asd  Muscle cramping No cramping today but has been reucrring for over 3 mo, pt encouraged for  hydration, for labs as ordered, lfexeril tid prn trial  Followup: Return if symptoms worsen or fail to improve.  Cathlean Cower, MD 07/24/2022 12:10 PM [facility]

## 2022-07-24 NOTE — Assessment & Plan Note (Signed)
No cramping today but has been reucrring for over 3 mo, pt encouraged for hydration, for labs as ordered, lfexeril tid prn trial

## 2022-07-24 NOTE — Assessment & Plan Note (Signed)
Exam c/w arthritis likely, for xray left and right ankles, volt gel prn topical asd

## 2022-08-01 ENCOUNTER — Ambulatory Visit (INDEPENDENT_AMBULATORY_CARE_PROVIDER_SITE_OTHER): Payer: Medicare HMO | Admitting: Sports Medicine

## 2022-08-01 VITALS — BP 110/80 | Ht 65.0 in | Wt 121.0 lb

## 2022-08-01 DIAGNOSIS — R29898 Other symptoms and signs involving the musculoskeletal system: Secondary | ICD-10-CM

## 2022-08-01 DIAGNOSIS — M25571 Pain in right ankle and joints of right foot: Secondary | ICD-10-CM | POA: Diagnosis not present

## 2022-08-01 DIAGNOSIS — M5136 Other intervertebral disc degeneration, lumbar region: Secondary | ICD-10-CM | POA: Diagnosis not present

## 2022-08-01 DIAGNOSIS — G8929 Other chronic pain: Secondary | ICD-10-CM | POA: Diagnosis not present

## 2022-08-01 DIAGNOSIS — M5126 Other intervertebral disc displacement, lumbar region: Secondary | ICD-10-CM | POA: Diagnosis not present

## 2022-08-01 DIAGNOSIS — M25572 Pain in left ankle and joints of left foot: Secondary | ICD-10-CM

## 2022-08-01 NOTE — Patient Instructions (Addendum)
Good to see you  Epidural right L4-L5 Follow up 2 weeks after injection to discuss results

## 2022-08-01 NOTE — Progress Notes (Signed)
Keith [name] D.[place] Phone: [phone]   Assessment and Plan:     1. DDD (degenerative disc disease), lumbar 2. Lumbar disc herniation 3. Weakness of both lower extremities 4. Chronic pain of both ankles -Chronic with exacerbation, initial sports medicine visit - Patient presents today with bilateral lower extremity weakness and pain, lower extremity spasms that have been ongoing and generally worsening over the past 3 years - Patient does have lumbar DDD and disc herniation seen from lumbar MRI in 06/2021.  This could potentially explain patient's symptoms, so we will proceed with lumbar epidural at right-sided L4-L5 - Based on patient's rigidity, intermittent tremors, slow movement, differential of parkinsonism is included.  If no improvement or worsening of symptoms after lumbar epidural, would refer to neurology for further evaluation - No significant findings on patient's prior x-rays of ankles that would explain his pain - DG INJECT DIAG/THERA/INC NEEDLE/CATH/PLC EPI/LUMB/SAC W/IMG; Future    Pertinent previous records reviewed include right ankle x-ray 07/24/2022, left ankle x-ray 07/24/2022, lumbar MRI 06/25/2021   Follow Up: 2 weeks after epidural to review benefit. - Based on patient's rigidity, intermittent tremors, slow movement, differential of parkinsonism is included.  If no improvement or worsening of symptoms after lumbar epidural, would refer to neurology for further evaluation   Subjective:   I, Keith [name], am serving as a Education administrator for Doctor Glennon Mac  Chief Complaint: right ankle   HPI:   08/01/22 Patient is a 71 year old male complaining of right ankle pain. Patient states that he has pain in both ankles he has tingling he muscles jump and his hands tingle , he feels the muscles in his feet jump now in his arms been going on for a couple of years, no meds for the pain ,only  takes what he is rx to take nothing else , radiating pain all over his body , antalgic gait and with a walking stick   Relevant Historical Information: History of alcohol abuse, hypertension, COPD, GERD  Additional pertinent review of systems negative.   Current Outpatient Medications:    Cholecalciferol (VITAMIN D3) 1.25 MG (50000 UT) CAPS, TAKE 1 CAPSULE EVERY WEEK, Disp: 12 capsule, Rfl: 0   cyclobenzaprine (FLEXERIL) 5 MG tablet, Take 1 tablet (5 mg total) by mouth 3 (three) times daily as needed., Disp: 40 tablet, Rfl: 1   fluticasone furoate-vilanterol (BREO ELLIPTA) 100-25 MCG/ACT AEPB, Inhale 1 puff into the lungs daily., Disp: 30 each, Rfl: 3   levofloxacin (LEVAQUIN) 750 MG tablet, Take 750 mg by mouth every morning., Disp: , Rfl:    MAGnesium-Oxide 400 (240 Mg) MG tablet, Take 1 tablet (400 mg total) by mouth daily., Disp: 90 tablet, Rfl: 1   Multiple Vitamins-Minerals (SENTRY ADULT PO), Sentry, Disp: , Rfl:    rosuvastatin (CRESTOR) 5 MG tablet, Take 1 tablet (5 mg total) by mouth daily., Disp: 90 tablet, Rfl: 1   Tadalafil 2.5 MG TABS, Tadalafil, Disp: , Rfl:    tamsulosin (FLOMAX) 0.4 MG CAPS capsule, Take 0.4 mg by mouth., Disp: , Rfl:    Tiotropium Bromide Monohydrate (SPIRIVA RESPIMAT) 2.5 MCG/ACT AERS, Inhale 2 puffs into the lungs daily., Disp: 4 g, Rfl: 0   Tiotropium Bromide Monohydrate (SPIRIVA RESPIMAT) 2.5 MCG/ACT AERS, Inhale 2 puffs into the lungs daily., Disp: 4 g, Rfl: 5   zinc gluconate 50 MG tablet, Take 1 tablet (50 mg total) by mouth daily., Disp: 90 tablet, Rfl: 1  Objective:     Vitals:   08/01/22 1355  BP: 110/80  Weight: 121 lb (54.9 kg)  Height: '5\' 5"'$  (1.651 m)      Body mass index is 20.14 kg/m.    Physical Exam:    Gen: Appears well, nad, nontoxic and pleasant Psych: Alert and oriented, appropriate mood and affect Neuro: sensation intact, strength is 5/5 in upper extremities, 4+ /5 in   lower extremities, muscle tone generally decreased  bilaterally Skin: no susupicious lesions or rashes  Back - Normal skin, Spine with normal alignment and no deformity.   No tenderness to vertebral process palpation.   Paraspinous muscles are not tender and without spasm NTTP gluteal musculature Straight leg raise negative Gen: Appears well, nad, nontoxic and pleasant Psych: Alert and oriented, appropriate mood and affect Neuro: sensation intact, strength is 5/5 with df/pf/inv/ev, muscle tone wnl Skin: no susupicious lesions or rashes  Bilateral ankle: no deformity, no swelling or effusion NTTP over fibular head, lat mal, medial mal, achilles, navicular, base of 5th, ATFL, CFL, deltoid, calcaneous or midfoot ROM DF 30, PF 45, inv/ev intact Negative ant drawer, talar tilt, rotation test, squeeze test. Neg thompson No pain with resisted inversion or eversion      Electronically signed by:  Keith [name] D.[facility] 2:26 PM 08/01/22

## 2022-08-08 ENCOUNTER — Encounter: Payer: Self-pay | Admitting: Sports Medicine

## 2022-08-14 ENCOUNTER — Ambulatory Visit
Admission: RE | Admit: 2022-08-14 | Discharge: 2022-08-14 | Disposition: A | Discharge status: Home / Self Care | Payer: Medicare HMO | Source: Ambulatory Visit | Attending: Sports Medicine | Admitting: Sports Medicine

## 2022-08-14 DIAGNOSIS — M5126 Other intervertebral disc displacement, lumbar region: Secondary | ICD-10-CM

## 2022-08-14 DIAGNOSIS — Z0389 Encounter for observation for other suspected diseases and conditions ruled out: Secondary | ICD-10-CM | POA: Diagnosis not present

## 2022-08-14 DIAGNOSIS — G8929 Other chronic pain: Secondary | ICD-10-CM

## 2022-08-14 DIAGNOSIS — M5136 Other intervertebral disc degeneration, lumbar region: Secondary | ICD-10-CM

## 2022-08-14 DIAGNOSIS — R29898 Other symptoms and signs involving the musculoskeletal system: Secondary | ICD-10-CM

## 2022-08-14 MED ORDER — IOPAMIDOL (ISOVUE-M 200) INJECTION 41%
1.0000 mL | Freq: Once | INTRAMUSCULAR | Status: AC
  Administered 2022-08-14: 1 mL via EPIDURAL

## 2022-08-14 MED ORDER — METHYLPREDNISOLONE ACETATE 40 MG/ML INJ SUSP (RADIOLOG
80.0000 mg | Freq: Once | INTRAMUSCULAR | Status: AC
  Administered 2022-08-14: 80 mg via EPIDURAL

## 2022-08-14 NOTE — Discharge Instructions (Signed)
Post Procedure Spinal Discharge Instruction Sheet  You may resume a regular diet and any medications that you routinely take (including pain medications) unless otherwise noted by MD.  No driving day of procedure.  Light activity throughout the rest of the day.  Do not do any strenuous work, exercise, bending or lifting.  The day following the procedure, you can resume normal physical activity but you should refrain from exercising or physical therapy for at least three days thereafter.  You may apply ice to the injection site, 20 minutes on, 20 minutes off, as needed. Do not apply ice directly to skin.    Common Side Effects:  Headaches- take your usual medications as directed by your physician.  Increase your fluid intake.  Caffeinated beverages may be helpful.  Lie flat in bed until your headache resolves.  Restlessness or inability to sleep- you may have trouble sleeping for the next few days.  Ask your referring physician if you need any medication for sleep.  Facial flushing or redness- should subside within a few days.  Increased pain- a temporary increase in pain a day or two following your procedure is not unusual.  Take your pain medication as prescribed by your referring physician.  Leg cramps  Please contact our office at [phone] for the following symptoms: Fever greater than 100 degrees. Headaches unresolved with medication after 2-3 days. Increased swelling, pain, or redness at injection site.   Thank you for visiting [facility] today.   MAY RESUME ASPIRIN IMMEDIATELY AFTER INJECTION TODAY.

## 2022-08-15 ENCOUNTER — Other Ambulatory Visit: Payer: Self-pay | Admitting: Internal Medicine

## 2022-08-15 DIAGNOSIS — E785 Hyperlipidemia, unspecified: Secondary | ICD-10-CM

## 2022-08-27 DIAGNOSIS — R972 Elevated prostate specific antigen [PSA]: Secondary | ICD-10-CM | POA: Diagnosis not present

## 2022-08-27 DIAGNOSIS — D075 Carcinoma in situ of prostate: Secondary | ICD-10-CM | POA: Diagnosis not present

## 2022-08-27 DIAGNOSIS — C61 Malignant neoplasm of prostate: Secondary | ICD-10-CM | POA: Diagnosis not present

## 2022-09-05 DIAGNOSIS — C61 Malignant neoplasm of prostate: Secondary | ICD-10-CM | POA: Diagnosis not present

## 2022-09-20 ENCOUNTER — Ambulatory Visit (INDEPENDENT_AMBULATORY_CARE_PROVIDER_SITE_OTHER): Payer: Medicare HMO

## 2022-09-20 VITALS — BP 124/60 | HR 66 | Temp 97.3°F | Ht 65.0 in | Wt 123.2 lb

## 2022-09-20 DIAGNOSIS — Z0000 Encounter for general adult medical examination without abnormal findings: Secondary | ICD-10-CM

## 2022-09-20 NOTE — Progress Notes (Addendum)
Subjective:   Keith [name] is a 72 y.o. male who presents for Medicare Annual/Subsequent preventive examination.  Review of Systems     Cardiac Risk Factors include: advanced age (>12mn, >>51women);dyslipidemia;family history of premature cardiovascular disease;hypertension;male gender     Objective:    Today's Vitals   09/20/22 1348  BP: 124/60  Pulse: 66  Temp: (!) 97.3 F (36.3 C)  SpO2: 99%  Weight: 123 lb 3.2 oz (55.9 kg)  Height: '5\' 5"'$  (1.651 m)  PainSc: 0-No pain   Body mass index is 20.5 kg/m.     09/20/2022    2:07 PM 06/23/2021    9:01 AM  Advanced Directives  Does Patient Have a Medical Advance Directive? No No  Would patient like information on creating a medical advance directive? No - Patient declined     Current Medications (verified) Outpatient Encounter Medications as of 09/20/2022  Medication Sig   Cholecalciferol (VITAMIN D3) 1.25 MG (50000 UT) CAPS TAKE 1 CAPSULE EVERY WEEK   cyclobenzaprine (FLEXERIL) 5 MG tablet Take 1 tablet (5 mg total) by mouth 3 (three) times daily as needed.   fluticasone furoate-vilanterol (BREO ELLIPTA) 100-25 MCG/ACT AEPB Inhale 1 puff into the lungs daily.   levofloxacin (LEVAQUIN) 750 MG tablet Take 750 mg by mouth every morning.   magnesium oxide (MAG-OX) 400 (240 Mg) MG tablet TAKE 1 TABLET EVERY DAY   Multiple Vitamins-Minerals (SENTRY ADULT PO) Sentry   rosuvastatin (CRESTOR) 5 MG tablet TAKE 1 TABLET EVERY DAY   Tadalafil 2.5 MG TABS Tadalafil   tamsulosin (FLOMAX) 0.4 MG CAPS capsule Take 0.4 mg by mouth.   Tiotropium Bromide Monohydrate (SPIRIVA RESPIMAT) 2.5 MCG/ACT AERS Inhale 2 puffs into the lungs daily.   Tiotropium Bromide Monohydrate (SPIRIVA RESPIMAT) 2.5 MCG/ACT AERS Inhale 2 puffs into the lungs daily.   zinc gluconate 50 MG tablet Take 1 tablet (50 mg total) by mouth daily.   No facility-administered encounter medications on file as of 09/20/2022.    Allergies (verified) Patient has no known  allergies.   History: Past Medical History:  Diagnosis Date   Chicken pox    COPD (chronic obstructive pulmonary disease) (HPleak    patient denies   Hyperlipidemia    on meds   Hypertension    Past Surgical History:  Procedure Laterality Date   COLONOSCOPY  2009   Dr.Orr-F/V-prep unknown-tortuous sigm colon-   Family History  Problem Relation Age of Onset   Hypertension Mother    Heart attack Mother        No details   Hypertension Father    Heart attack Father        No details   Heart attack Sister        No details   Pancreatic cancer Brother    Alcohol abuse Brother    Alcohol abuse Brother    Hypertension Brother    Lung cancer Brother    Colon polyps Neg Hx    Colon cancer Neg Hx    Esophageal cancer Neg Hx    Stomach cancer Neg Hx    Rectal cancer Neg Hx    Social History   Socioeconomic History   Marital status: Married    Spouse name: Not on file   Number of children: 2   Years of education: Not on file   Highest education level: Not on file  Occupational History   Not on file  Tobacco Use   Smoking status: Former    Packs/day: 0.50  Years: 53.00    Total pack years: 26.50    Types: Cigarettes    Start date: 02/15/1969    Quit date: 07/04/2022    Years since quitting: 0.2   Smokeless tobacco: Never   Tobacco comments:    Smokes about 3-4 cigarettes per day  Vaping Use   Vaping Use: Never used  Substance and Sexual Activity   Alcohol use: Not Currently    Alcohol/week: 2.0 standard drinks of alcohol    Types: 2 Standard drinks or equivalent per week    Comment: Patient quit drinking 03/2021   Drug use: Never   Sexual activity: Not Currently    Partners: Female  Other Topics Concern   Not on file  Social History Narrative   Lives with wife, retired.  2 children and one grand.     Lives in a one story    Right Handed, but patient can use left    Social Determinants of Health   Financial Resource Strain: Low Risk  (09/20/2022)   Overall  Financial Resource Strain (CARDIA)    Difficulty of Paying Living Expenses: Not hard at all  Food Insecurity: No Food Insecurity (09/20/2022)   Hunger Vital Sign    Worried About Running Out of Food in the Last Year: Never true    Ran Out of Food in the Last Year: Never true  Transportation Needs: No Transportation Needs (09/20/2022)   PRAPARE - Hydrologist (Medical): No    Lack of Transportation (Non-Medical): No  Physical Activity: Not on file  Stress: No Stress Concern Present (09/20/2022)   [place]    Feeling of Stress : Not at all  Social Connections: [place] (09/20/2022)   Social Connection and Isolation Panel [NHANES]    Frequency of Communication with Friends and Family: More than three times a week    Frequency of Social Gatherings with Friends and Family: More than three times a week    Attends Religious Services: More than 4 times per year    Active Member of [employer] or Organizations: Yes    Attends Music therapist: More than 4 times per year    Marital Status: Married    Tobacco Counseling Counseling given: Not Answered Tobacco comments: Smokes about 3-4 cigarettes per day   Clinical Intake:  Pre-visit preparation completed: Yes  Pain : No/denies pain Pain Score: 0-No pain     BMI - recorded: 20.5 Nutritional Status: BMI of 19-24  Normal Nutritional Risks: None Diabetes: No  How often do you need to have someone help you when you read instructions, pamphlets, or other written materials from your doctor or pharmacy?: 1 - Never What is the last grade level you completed in school?: HSG  Diabetic? No  Interpreter Needed?: No  Information entered by :: Lisette Abu, LPN.   Activities of Daily Living    09/20/2022    1:50 PM  In your present state of health, do you have any difficulty performing the following activities:  Hearing? 0   Vision? 0  Difficulty concentrating or making decisions? 0  Walking or climbing stairs? 0  Dressing or bathing? 0  Doing errands, shopping? 0  Preparing Food and eating ? N  Using the Toilet? N  In the past six months, have you accidently leaked urine? N  Do you have problems with loss of bowel control? N  Managing your Medications? N  Managing your Finances?  N  Housekeeping or managing your Housekeeping? N    Patient Care Team: Janith Lima, MD as PCP - General (Internal Medicine) Alda Berthold, DO as Consulting Physician (Neurology) [place], Darnelle Maffucci, [facility] (Inactive) as Pharmacist (Pharmacist)  Indicate any recent Medical Services you may have received from other than [facility] providers in the past year (date may be approximate).     Assessment:   This is a routine wellness examination for Keith [name].  Hearing/Vision screen Hearing Screening - Comments:: Patient wears hearing aids. Vision Screening - Comments:: Wears rx glasses - not up to date with routine eye exams.   Dietary issues and exercise activities discussed: Current Exercise Habits: Home exercise routine, Type of exercise: walking;Other - see comments (yard work), Time (Minutes): 30, Frequency (Times/Week): 5, Weekly Exercise (Minutes/Week): 150, Intensity: Mild, Exercise limited by: respiratory conditions(s)   Goals Addressed             This Visit's Progress    My goal for 2024 to maintain my health and exercise more.        Depression Screen    09/20/2022    1:49 PM 04/06/2021    1:35 PM  PHQ 2/9 Scores  PHQ - 2 Score 0 0    Fall Risk    09/20/2022    2:08 PM 06/23/2021    9:00 AM 04/06/2021    1:35 PM  [place] in the past year? 0 0 1  Number falls in past yr: 0 0 0  Injury with Fall? 0 0 0  Risk for fall due to : No Fall Risks    Follow up Falls prevention discussed      [place]:  Any stairs in or around the home? No  If so, are there any without  handrails? No  Home free of loose throw rugs in walkways, pet beds, electrical cords, etc? Yes  Adequate lighting in your home to reduce risk of falls? Yes   ASSISTIVE DEVICES UTILIZED TO PREVENT FALLS:  Life alert? No  Use of a cane, walker or w/c? Yes  Grab bars in the bathroom? No  Shower chair or bench in shower? No  Elevated toilet seat or a handicapped toilet? No   TIMED UP AND GO:  Was the test performed? Yes .  Length of time to ambulate 10 feet: 10 sec.   Gait slow and steady with assistive device  Cognitive Function:        09/20/2022    1:50 PM  6CIT Screen  What Year? 0 points  What month? 0 points  What time? 0 points  Count back from 20 0 points  Months in reverse 0 points  Repeat phrase 0 points  Total Score 0 points    Immunizations Immunization History  Administered Date(s) Administered   Fluad Quad(high Dose 65+) 06/07/2021   PFIZER(Purple Top)SARS-COV-2 Vaccination 10/23/2019, 11/13/2019, 06/27/2020   PNEUMOCOCCAL CONJUGATE-20 04/06/2021    TDAP status: Due, Education has been provided regarding the importance of this vaccine. Advised may receive this vaccine at local pharmacy or Health Dept. Aware to provide a copy of the vaccination record if obtained from local pharmacy or Health Dept. Verbalized acceptance and understanding.  Flu Vaccine status: Due, Education has been provided regarding the importance of this vaccine. Advised may receive this vaccine at local pharmacy or Health Dept. Aware to provide a copy of the vaccination record if obtained from local pharmacy or Health Dept. Verbalized  acceptance and understanding.  Pneumococcal vaccine status: Up to date  Covid-19 vaccine status: Completed vaccines  Qualifies for Shingles Vaccine? Yes   Zostavax completed No   Shingrix Completed?: No.    Education has been provided regarding the importance of this vaccine. Patient has been advised to call insurance company to determine out of pocket  expense if they have not yet received this vaccine. Advised may also receive vaccine at local pharmacy or Health Dept. Verbalized acceptance and understanding.  Screening Tests Health Maintenance  Topic Date Due   DTaP/Tdap/Td (1 - Tdap) Never done   Zoster Vaccines- Shingrix (1 of 2) Never done   INFLUENZA VACCINE  04/17/2022   COVID-19 Vaccine (4 - 2023-24 season) 05/18/2022   Lung Cancer Screening  10/10/2022   Medicare Annual Wellness (AWV)  09/21/2023   COLONOSCOPY (Pts 45-76yr Insurance coverage will need to be confirmed)  06/14/2031   Pneumonia Vaccine 72 Years old  Completed   Hepatitis C Screening  Completed   HPV VACCINES  Aged Out    Health Maintenance  Health Maintenance Due  Topic Date Due   DTaP/Tdap/Td (1 - Tdap) Never done   Zoster Vaccines- Shingrix (1 of 2) Never done   INFLUENZA VACCINE  04/17/2022   COVID-19 Vaccine (4 - 2023-24 season) 05/18/2022   Lung Cancer Screening  10/10/2022    Colorectal cancer screening: Type of screening: Colonoscopy. Completed 06/13/2021. Repeat every 10 years  Lung Cancer Screening: (Low Dose CT Chest recommended if Age 72-80years, 30 pack-year currently smoking OR have quit w/in 15years.) does qualify.   Lung Cancer Screening Referral: patient already scheduled.  Additional Screening:  Hepatitis C Screening: does qualify; Completed 04/06/2021  Vision Screening: Recommended annual ophthalmology exams for early detection of glaucoma and other disorders of the eye. Is the patient up to date with their annual eye exam?  No  Who is the provider or what is the name of the office in which the patient attends annual eye exams? Patient stated no issues with vision. If pt is not established with a provider, would they like to be referred to a provider to establish care? No .   Dental Screening: Recommended annual dental exams for proper oral hygiene  Community Resource Referral / Chronic Care Management: CRR required this visit?   No   CCM required this visit?  No      Plan:     I have personally reviewed and noted the following in the patient's chart:   Medical and social history Use of alcohol, tobacco or illicit drugs  Current medications and supplements including opioid prescriptions. Patient is not currently taking opioid prescriptions. Functional ability and status Nutritional status Physical activity Advanced directives List of other physicians Hospitalizations, surgeries, and ER visits in previous 12 months Vitals Screenings to include cognitive, depression, and falls Referrals and appointments  In addition, I have reviewed and discussed with patient certain preventive protocols, quality metrics, and best practice recommendations. A written personalized care plan for preventive services as well as general preventive health recommendations were provided to patient.     SSheral Flow LPN   14/12/8183  Nurse Notes:  Normal cognitive status assessed by direct observation by this Nurse Health Advisor. No abnormalities found.

## 2022-09-20 NOTE — Patient Instructions (Signed)
Mr. Keith [name] , Thank you for taking time to come for your Medicare Wellness Visit. I appreciate your ongoing commitment to your health goals. Please review the following plan we discussed and let me know if I can assist you in the future.   These are the goals we discussed:  Goals      My goal for 2024 to maintain my health and exercise more.        This is a list of the screening recommended for you and due dates:  Health Maintenance  Topic Date Due   DTaP/Tdap/Td vaccine (1 - Tdap) Never done   Zoster (Shingles) Vaccine (1 of 2) Never done   Flu Shot  04/17/2022   COVID-19 Vaccine (4 - 2023-24 season) 05/18/2022   Screening for Lung Cancer  10/10/2022   Medicare Annual Wellness Visit  09/21/2023   Colon Cancer Screening  06/14/2031   Pneumonia Vaccine  Completed   Hepatitis C Screening: USPSTF Recommendation to screen - Ages 18-79 yo.  Completed   HPV Vaccine  Aged Out    Advanced directives: No  Conditions/risks identified: Yes  Next appointment: Follow up in one year for your annual wellness visit.   Preventive Care 87 Years and Older, Male  Preventive care refers to lifestyle choices and visits with your health care provider that can promote health and wellness. What does preventive care include? A yearly physical exam. This is also called an annual well check. Dental exams once or twice a year. Routine eye exams. Ask your health care provider how often you should have your eyes checked. Personal lifestyle choices, including: Daily care of your teeth and gums. Regular physical activity. Eating a healthy diet. Avoiding tobacco and drug use. Limiting alcohol use. Practicing safe sex. Taking low doses of aspirin every day. Taking vitamin and mineral supplements as recommended by your health care provider. What happens during an annual well check? The services and screenings done by your health care provider during your annual well check will depend on your age,  overall health, lifestyle risk factors, and family history of disease. Counseling  Your health care provider may ask you questions about your: Alcohol use. Tobacco use. Drug use. Emotional well-being. Home and relationship well-being. Sexual activity. Eating habits. History of falls. Memory and ability to understand (cognition). Work and work Statistician. Screening  You may have the following tests or measurements: Height, weight, and BMI. Blood pressure. Lipid and cholesterol levels. These may be checked every 5 years, or more frequently if you are over 93 years old. Skin check. Lung cancer screening. You may have this screening every year starting at age 33 if you have a 30-pack-year history of smoking and currently smoke or have quit within the past 15 years. Fecal occult blood test (FOBT) of the stool. You may have this test every year starting at age 83. Flexible sigmoidoscopy or colonoscopy. You may have a sigmoidoscopy every 5 years or a colonoscopy every 10 years starting at age 32. Prostate cancer screening. Recommendations will vary depending on your family history and other risks. Hepatitis C blood test. Hepatitis B blood test. Sexually transmitted disease (STD) testing. Diabetes screening. This is done by checking your blood sugar (glucose) after you have not eaten for a while (fasting). You may have this done every 1-3 years. Abdominal aortic aneurysm (AAA) screening. You may need this if you are a current or former smoker. Osteoporosis. You may be screened starting at age 9 if you are at high risk.  Talk with your health care provider about your test results, treatment options, and if necessary, the need for more tests. Vaccines  Your health care provider may recommend certain vaccines, such as: Influenza vaccine. This is recommended every year. Tetanus, diphtheria, and acellular pertussis (Tdap, Td) vaccine. You may need a Td booster every 10 years. Zoster vaccine.  You may need this after age 7. Pneumococcal 13-valent conjugate (PCV13) vaccine. One dose is recommended after age 64. Pneumococcal polysaccharide (PPSV23) vaccine. One dose is recommended after age 28. Talk to your health care provider about which screenings and vaccines you need and how often you need them. This information is not intended to replace advice given to you by your health care provider. Make sure you discuss any questions you have with your health care provider. Document Released: 09/30/2015 Document Revised: 05/23/2016 Document Reviewed: 07/05/2015 Elsevier Interactive Patient Education  2017 [place] Prevention in the Home Falls can cause injuries. They can happen to people of all ages. There are many things you can do to make your home safe and to help prevent falls. What can I do on the outside of my home? Regularly fix the edges of walkways and driveways and fix any cracks. Remove anything that might make you trip as you walk through a door, such as a raised step or threshold. Trim any bushes or trees on the path to your home. Use bright outdoor lighting. Clear any walking paths of anything that might make someone trip, such as rocks or tools. Regularly check to see if handrails are loose or broken. Make sure that both sides of any steps have handrails. Any raised decks and porches should have guardrails on the edges. Have any leaves, snow, or ice cleared regularly. Use sand or salt on walking paths during winter. Clean up any spills in your garage right away. This includes oil or grease spills. What can I do in the bathroom? Use night lights. Install grab bars by the toilet and in the tub and shower. Do not use towel bars as grab bars. Use non-skid mats or decals in the tub or shower. If you need to sit down in the shower, use a plastic, non-slip stool. Keep the floor dry. Clean up any water that spills on the floor as soon as it happens. Remove soap  buildup in the tub or shower regularly. Attach bath mats securely with double-sided non-slip rug tape. Do not have throw rugs and other things on the floor that can make you trip. What can I do in the bedroom? Use night lights. Make sure that you have a light by your bed that is easy to reach. Do not use any sheets or blankets that are too big for your bed. They should not hang down onto the floor. Have a firm chair that has side arms. You can use this for support while you get dressed. Do not have throw rugs and other things on the floor that can make you trip. What can I do in the kitchen? Clean up any spills right away. Avoid walking on wet floors. Keep items that you use a lot in easy-to-reach places. If you need to reach something above you, use a strong step stool that has a grab bar. Keep electrical cords out of the way. Do not use floor polish or wax that makes floors slippery. If you must use wax, use non-skid floor wax. Do not have throw rugs and other things on the floor that can make  you trip. What can I do with my stairs? Do not leave any items on the stairs. Make sure that there are handrails on both sides of the stairs and use them. Fix handrails that are broken or loose. Make sure that handrails are as long as the stairways. Check any carpeting to make sure that it is firmly attached to the stairs. Fix any carpet that is loose or worn. Avoid having throw rugs at the top or bottom of the stairs. If you do have throw rugs, attach them to the floor with carpet tape. Make sure that you have a light switch at the top of the stairs and the bottom of the stairs. If you do not have them, ask someone to add them for you. What else can I do to help prevent falls? Wear shoes that: Do not have high heels. Have rubber bottoms. Are comfortable and fit you well. Are closed at the toe. Do not wear sandals. If you use a stepladder: Make sure that it is fully opened. Do not climb a closed  stepladder. Make sure that both sides of the stepladder are locked into place. Ask someone to hold it for you, if possible. Clearly mark and make sure that you can see: Any grab bars or handrails. First and last steps. Where the edge of each step is. Use tools that help you move around (mobility aids) if they are needed. These include: Canes. Walkers. Scooters. Crutches. Turn on the lights when you go into a dark area. Replace any light bulbs as soon as they burn out. Set up your furniture so you have a clear path. Avoid moving your furniture around. If any of your floors are uneven, fix them. If there are any pets around you, be aware of where they are. Review your medicines with your doctor. Some medicines can make you feel dizzy. This can increase your chance of falling. Ask your doctor what other things that you can do to help prevent falls. This information is not intended to replace advice given to you by your health care provider. Make sure you discuss any questions you have with your health care provider. Document Released: 06/30/2009 Document Revised: 02/09/2016 Document Reviewed: 10/08/2014 Elsevier Interactive Patient Education  2017 [employer].

## 2022-09-25 DIAGNOSIS — L905 Scar conditions and fibrosis of skin: Secondary | ICD-10-CM | POA: Diagnosis not present

## 2022-09-25 DIAGNOSIS — L299 Pruritus, unspecified: Secondary | ICD-10-CM | POA: Diagnosis not present

## 2022-09-25 DIAGNOSIS — D485 Neoplasm of uncertain behavior of skin: Secondary | ICD-10-CM | POA: Diagnosis not present

## 2022-09-25 DIAGNOSIS — L700 Acne vulgaris: Secondary | ICD-10-CM | POA: Diagnosis not present

## 2022-10-03 ENCOUNTER — Other Ambulatory Visit: Payer: Self-pay | Admitting: Internal Medicine

## 2022-10-03 DIAGNOSIS — E559 Vitamin D deficiency, unspecified: Secondary | ICD-10-CM

## 2022-10-05 DIAGNOSIS — L739 Follicular disorder, unspecified: Secondary | ICD-10-CM | POA: Diagnosis not present

## 2022-10-05 DIAGNOSIS — L299 Pruritus, unspecified: Secondary | ICD-10-CM | POA: Diagnosis not present

## 2022-10-05 DIAGNOSIS — T1490XD Injury, unspecified, subsequent encounter: Secondary | ICD-10-CM | POA: Diagnosis not present

## 2022-10-10 ENCOUNTER — Ambulatory Visit
Admission: RE | Admit: 2022-10-10 | Discharge: 2022-10-10 | Disposition: A | Discharge status: Home / Self Care | Payer: Medicare HMO | Source: Ambulatory Visit | Attending: Internal Medicine | Admitting: Internal Medicine

## 2022-10-10 DIAGNOSIS — F17210 Nicotine dependence, cigarettes, uncomplicated: Secondary | ICD-10-CM | POA: Diagnosis not present

## 2022-10-10 DIAGNOSIS — Z87891 Personal history of nicotine dependence: Secondary | ICD-10-CM

## 2022-10-10 DIAGNOSIS — F17200 Nicotine dependence, unspecified, uncomplicated: Secondary | ICD-10-CM

## 2022-10-12 ENCOUNTER — Other Ambulatory Visit: Payer: Self-pay

## 2022-10-12 DIAGNOSIS — F17210 Nicotine dependence, cigarettes, uncomplicated: Secondary | ICD-10-CM

## 2022-10-12 DIAGNOSIS — Z87891 Personal history of nicotine dependence: Secondary | ICD-10-CM

## 2022-10-12 DIAGNOSIS — Z122 Encounter for screening for malignant neoplasm of respiratory organs: Secondary | ICD-10-CM

## 2022-10-15 ENCOUNTER — Ambulatory Visit: Payer: Medicare HMO | Admitting: Pulmonary Disease

## 2022-10-15 ENCOUNTER — Encounter: Payer: Self-pay | Admitting: Pulmonary Disease

## 2022-10-15 VITALS — BP 118/80 | HR 61 | Ht 65.0 in | Wt 120.2 lb

## 2022-10-15 DIAGNOSIS — F17210 Nicotine dependence, cigarettes, uncomplicated: Secondary | ICD-10-CM | POA: Diagnosis not present

## 2022-10-15 DIAGNOSIS — J432 Centrilobular emphysema: Secondary | ICD-10-CM

## 2022-10-15 MED ORDER — TRELEGY ELLIPTA 100-62.5-25 MCG/ACT IN AEPB: 1.0000 | INHALATION_SPRAY | Freq: Every day | RESPIRATORY_TRACT | 0 refills | Status: DC

## 2022-10-15 NOTE — Progress Notes (Signed)
Synopsis: Referred in August 2023 for mucopurulent bronchitis by Sanda Linger, MD  Subjective:   PATIENT ID: Keith [name] GENDER: male DOB: [birth year], MRN: [id number]  HPI  Chief Complaint  Patient presents with   Follow-up    F/U on emphysema. States his breathing has been stable since last visit. Still using his Breo inhaler.    Keith [name] is a 72 year old male, daily smoker with hypertension who returns to pulmonary clinic for COPD.   He was seen by Dr. Marchelle Gearing in follow up on 07/10/22 where PFTs showed mild diffusion defect. He was unsure if he noted benefit to [facility]. He was started on spiriva 2.56mcg 2 puffs daily. He feels it helped his breathing somewhat. He has intermittent dry cough.   He is smoking intermittently now. A pack may last him 2 weeks.   Initial OV 04/24/22 He has centrilobular emphysema noted on lung cancer screening CT Chest 09/2021 along with a 7.82mm LUL pulmonary nodule that is stable from 05/2021.   He reports cough over the past 2 years that is occasionally productive of yellowish white sputum. He denies wheezing. He has exertional dyspnea with strenuous activity otherwise denies dyspnea when walking through a store. He is active doing yardwork at his home.   He has been smoking for 40 years. He is currently smoking 2-3 cigarettes per day. He is a retired Scientist, water quality. He is currently undergoing lung cancer screening by his primary care team. He lives with his wife.    Past Medical History:  Diagnosis Date   Chicken pox    COPD (chronic obstructive pulmonary disease) (HCC)    patient denies   Hyperlipidemia    on meds   Hypertension      Family History  Problem Relation Age of Onset   Hypertension Mother    Heart attack Mother        No details   Hypertension Father    Heart attack Father        No details   Heart attack Sister        No details   Pancreatic cancer Brother    Alcohol abuse Brother    Alcohol abuse Brother     Hypertension Brother    Lung cancer Brother    Colon polyps Neg Hx    Colon cancer Neg Hx    Esophageal cancer Neg Hx    Stomach cancer Neg Hx    Rectal cancer Neg Hx      Social History   Socioeconomic History   Marital status: Married    Spouse name: Not on file   Number of children: 2   Years of education: Not on file   Highest education level: Not on file  Occupational History   Not on file  Tobacco Use   Smoking status: Former    Packs/day: 0.50    Years: 53.00    Total pack years: 26.50    Types: Cigarettes    Start date: 02/15/1969    Quit date: 07/04/2022    Years since quitting: 0.2   Smokeless tobacco: Never   Tobacco comments:    Smokes about 3-4 cigarettes per day  Vaping Use   Vaping Use: Never used  Substance and Sexual Activity   Alcohol use: Not Currently    Alcohol/week: 2.0 standard drinks of alcohol    Types: 2 Standard drinks or equivalent per week    Comment: Patient quit drinking 03/2021   Drug use: Never  Sexual activity: Not Currently    Partners: Female  Other Topics Concern   Not on file  Social History Narrative   Lives with wife, retired.  2 children and one grand.     Lives in a one story    Right Handed, but patient can use left    Social Determinants of Health   Financial Resource Strain: Low Risk  (09/20/2022)   Overall Financial Resource Strain (CARDIA)    Difficulty of Paying Living Expenses: Not hard at all  Food Insecurity: No Food Insecurity (09/20/2022)   Hunger Vital Sign    Worried About Running Out of Food in the Last Year: Never true    Ran Out of Food in the Last Year: Never true  Transportation Needs: No Transportation Needs (09/20/2022)   PRAPARE - Administrator, Civil Service (Medical): No    Lack of Transportation (Non-Medical): No  Physical Activity: Not on file  Stress: No Stress Concern Present (09/20/2022)   [name]    Feeling of  Stress : Not at all  Social Connections: Socially Integrated (09/20/2022)   Social Connection and Isolation Panel [NHANES]    Frequency of Communication with Friends and Family: More than three times a week    Frequency of Social Gatherings with Friends and Family: More than three times a week    Attends Religious Services: More than 4 times per year    Active Member of [employer] or Organizations: Yes    Attends Engineer, structural: More than 4 times per year    Marital Status: Married  Catering manager Violence: Not At Risk (09/20/2022)   Humiliation, Afraid, Rape, and Kick questionnaire    Fear of Current or Ex-Partner: No    Emotionally Abused: No    Physically Abused: No    Sexually Abused: No     No Known Allergies   Outpatient Medications Prior to Visit  Medication Sig Dispense Refill   Cholecalciferol (VITAMIN D3) 1.25 MG (50000 UT) CAPS TAKE 1 CAPSULE EVERY WEEK 4 capsule 0   cyclobenzaprine (FLEXERIL) 5 MG tablet Take 1 tablet (5 mg total) by mouth 3 (three) times daily as needed. 40 tablet 1   magnesium oxide (MAG-OX) 400 (240 Mg) MG tablet TAKE 1 TABLET EVERY DAY 90 tablet 0   Multiple Vitamins-Minerals (SENTRY ADULT PO) Sentry     rosuvastatin (CRESTOR) 5 MG tablet TAKE 1 TABLET EVERY DAY 90 tablet 0   Tadalafil 2.5 MG TABS Tadalafil     tamsulosin (FLOMAX) 0.4 MG CAPS capsule Take 0.4 mg by mouth.     zinc gluconate 50 MG tablet Take 1 tablet (50 mg total) by mouth daily. 90 tablet 1   fluticasone furoate-vilanterol (BREO ELLIPTA) 100-25 MCG/ACT AEPB Inhale 1 puff into the lungs daily. 30 each 3   Tiotropium Bromide Monohydrate (SPIRIVA RESPIMAT) 2.5 MCG/ACT AERS Inhale 2 puffs into the lungs daily. 4 g 5   levofloxacin (LEVAQUIN) 750 MG tablet Take 750 mg by mouth every morning.     Tiotropium Bromide Monohydrate (SPIRIVA RESPIMAT) 2.5 MCG/ACT AERS Inhale 2 puffs into the lungs daily. 4 g 0   No facility-administered medications prior to visit.    Review of  Systems  Constitutional:  Negative for chills, fever, malaise/fatigue and weight loss.  HENT:  Negative for congestion, sinus pain and sore throat.   Eyes: Negative.   Respiratory:  Positive for cough and shortness of breath. Negative for hemoptysis,  sputum production and wheezing.   Cardiovascular:  Negative for chest pain, palpitations, orthopnea, claudication and leg swelling.  Gastrointestinal:  Negative for abdominal pain, heartburn, nausea and vomiting.  Genitourinary: Negative.   Musculoskeletal:  Negative for joint pain and myalgias.  Skin:  Negative for rash.  Neurological:  Negative for weakness.  Endo/Heme/Allergies: Negative.   Psychiatric/Behavioral: Negative.     Objective:   Vitals:   10/15/22 0855  BP: 118/80  Pulse: 61  SpO2: 100%  Weight: 120 lb 3.2 oz (54.5 kg)  Height: 5\' 5"  (1.651 m)   Physical Exam Constitutional:      General: He is not in acute distress. HENT:     Head: Normocephalic and atraumatic.  Eyes:     Extraocular Movements: Extraocular movements intact.     Conjunctiva/sclera: Conjunctivae normal.     Pupils: Pupils are equal, round, and reactive to light.  Cardiovascular:     Rate and Rhythm: Normal rate and regular rhythm.     Pulses: Normal pulses.     Heart sounds: Normal heart sounds. No murmur heard. Pulmonary:     Effort: Pulmonary effort is normal.     Breath sounds: Normal breath sounds.  Abdominal:     General: Bowel sounds are normal.     Palpations: Abdomen is soft.  Musculoskeletal:     Right [name] leg: No edema.     Left [name] leg: No edema.  Lymphadenopathy:     Cervical: No cervical adenopathy.  Skin:    General: Skin is warm and dry.  Neurological:     General: No focal deficit present.     Mental Status: He is alert.  Psychiatric:        Mood and Affect: Mood normal.        Behavior: Behavior normal.        Thought Content: Thought content normal.        Judgment: Judgment normal.     CBC    Component  Value Date/Time   WBC 3.4 (L) 03/22/2022 0937   RBC 3.99 (L) 03/22/2022 0937   HGB 11.3 (L) 03/22/2022 0937   HCT 34.4 (L) 03/22/2022 0937   PLT 270.0 03/22/2022 0937   MCV 86.4 03/22/2022 0937   MCHC 32.9 03/22/2022 0937   RDW 13.9 03/22/2022 0937   LYMPHSABS 1.6 03/22/2022 0937   MONOABS 0.3 03/22/2022 0937   EOSABS 0.2 03/22/2022 0937   BASOSABS 0.0 03/22/2022 0937      Latest Ref Rng & Units 03/22/2022    9:37 AM 06/07/2021    3:55 PM 04/06/2021    2:42 PM  BMP  Glucose 70 - 99 mg/dL 161  87  79   BUN 6 - 23 mg/dL 16  17  12    Creatinine 0.40 - 1.50 mg/dL 0.96  0.45  4.09   Sodium 135 - 145 mEq/L 140  139  141   Potassium 3.5 - 5.1 mEq/L 4.1  4.1  4.4   Chloride 96 - 112 mEq/L 104  102  103   CO2 19 - 32 mEq/L 31  31  29    Calcium 8.4 - 10.5 mg/dL 9.5  9.6  9.6    Chest imaging: LCS CT Chest 10/10/21 1. Lung-RADS 2, benign appearance or behavior. Continue annual screening with low-dose chest CT without contrast in 12 months. 2. RCA coronary artery calcification. 3. Nonobstructing left renal calculus. 4. Aortic Atherosclerosis (ICD10-I70.0) and Emphysema  LCS CT Chest 10/10/21 - Mild diffuse bronchial wall thickening with  mild centrilobular and paraseptal emphysema; imaging findings suggestive of underlying COPD. - Lung RADS 2S - stable 7.92mm RUL nodule from 06/02/21  PFT:    Latest Ref Rng & Units 06/28/2022    3:53 PM  PFT Results  FVC-Pre L 2.81   FVC-Predicted Pre % 80   FVC-Post L 2.86   FVC-Predicted Post % 81   Pre FEV1/FVC % % 73   Post FEV1/FCV % % 76   FEV1-Pre L 2.06   FEV1-Predicted Pre % 80   FEV1-Post L 2.18   DLCO uncorrected ml/min/mmHg 14.07   DLCO UNC% % 65   DLCO corrected ml/min/mmHg 14.07   DLCO COR %Predicted % 65   DLVA Predicted % 71   TLC L 5.10   TLC % Predicted % 86   RV % Predicted % 109     Labs:  Path:  Echo:  Heart Catheterization:  Assessment & Plan:   Centrilobular emphysema (HCC)  Cigarette  smoker  Discussion: Keith [name] is a 72 year old male, daily smoker with hypertension who is referred to pulmonary clinic for COPD.   He is to try trelegy ellipta 1 puff daily and let us know if he notices benefit.   He is to continue lung cancer screening.  He is to check with his dermatologist regarding the skin rash on his back and his history of raynauds if they are related. He says the rash is related to a fungal infection/reaction.   Follow up in 6 months.   Melody Comas, MD [facility] Office: [phone]   Current Outpatient Medications:    Cholecalciferol (VITAMIN D3) 1.25 MG (50000 UT) CAPS, TAKE 1 CAPSULE EVERY WEEK, Disp: 4 capsule, Rfl: 0   cyclobenzaprine (FLEXERIL) 5 MG tablet, Take 1 tablet (5 mg total) by mouth 3 (three) times daily as needed., Disp: 40 tablet, Rfl: 1   Fluticasone-Umeclidin-Vilant (TRELEGY ELLIPTA) 100-62.5-25 MCG/ACT AEPB, Inhale 1 puff into the lungs daily., Disp: 2 each, Rfl: 0   magnesium oxide (MAG-OX) 400 (240 Mg) MG tablet, TAKE 1 TABLET EVERY DAY, Disp: 90 tablet, Rfl: 0   Multiple Vitamins-Minerals (SENTRY ADULT PO), Sentry, Disp: , Rfl:    rosuvastatin (CRESTOR) 5 MG tablet, TAKE 1 TABLET EVERY DAY, Disp: 90 tablet, Rfl: 0   Tadalafil 2.5 MG TABS, Tadalafil, Disp: , Rfl:    tamsulosin (FLOMAX) 0.4 MG CAPS capsule, Take 0.4 mg by mouth., Disp: , Rfl:    zinc gluconate 50 MG tablet, Take 1 tablet (50 mg total) by mouth daily., Disp: 90 tablet, Rfl: 1

## 2022-10-15 NOTE — Patient Instructions (Addendum)
Recommend using mini nicotine lozenge as needed in order to quit smoking cigarettes for good.  You lung cancer CT chest scan looks good, will plan to get another scan in 1 year.  Try trelegy ellipta 1 puff daily and let us know if you like this inhaler.   Check with your dermatologist whether your skin rash an Raynaud's syndrome could be related.  Follow up in 6 months

## 2022-10-18 ENCOUNTER — Encounter: Payer: Self-pay | Admitting: Pulmonary Disease

## 2022-10-24 DIAGNOSIS — R350 Frequency of micturition: Secondary | ICD-10-CM | POA: Diagnosis not present

## 2022-10-24 DIAGNOSIS — C61 Malignant neoplasm of prostate: Secondary | ICD-10-CM | POA: Diagnosis not present

## 2022-10-24 DIAGNOSIS — N401 Enlarged prostate with lower urinary tract symptoms: Secondary | ICD-10-CM | POA: Diagnosis not present

## 2022-10-29 NOTE — Progress Notes (Signed)
GU Location of Tumor / Histology: Prostate Ca  If Prostate Cancer, Gleason Score is (3 + 4) and PSA is (4.8 on 05/2022)  Biopsies      Past/Anticipated interventions by urology, if any:     Past/Anticipated interventions by medical oncology, if any:  NA  Weight changes, if any: Loss 2-3 lbs.  IPSS:  12 SHIM:  1  Bowel/Bladder complaints, if any:  Constipation takes stool softeners as needed.  Weak urinary stream and frquency.  Nausea/Vomiting, if any:  No  Pain issues, if any:  0/10  SAFETY ISSUES: Prior radiation? No, given radiation website prior to visit to view. Pacemaker/ICD? No Possible current pregnancy? Male Is the patient on methotrexate? No  Current Complaints / other details:

## 2022-10-30 ENCOUNTER — Telehealth: Payer: Self-pay | Admitting: Pulmonary Disease

## 2022-11-01 ENCOUNTER — Other Ambulatory Visit: Payer: Self-pay | Admitting: Internal Medicine

## 2022-11-01 ENCOUNTER — Telehealth: Payer: Self-pay

## 2022-11-01 DIAGNOSIS — E785 Hyperlipidemia, unspecified: Secondary | ICD-10-CM

## 2022-11-01 NOTE — Telephone Encounter (Signed)
Left message for patient to call back  

## 2022-11-01 NOTE — Telephone Encounter (Signed)
Please send in prescription of trelegy 100 if this inhaler helped with his breathing. If it is not covered or too expensive, then check with out pharmacy team which ICS/LABA inhaler and LAMA inhaler are best covered for him.   Recommend using mini nicotine lozenges to help quit smoking which he can pick up at the pharmacy over the counter.  Thanks, JD

## 2022-11-01 NOTE — Telephone Encounter (Signed)
[place] (wife) of patient called had some questions about consult visit on 11/05/2022.  Mrs. Graddick thought the visit was in-person and wanted to know why she needed a smartphone to meet in-person.  RN explained to Mrs Ribeiro that the consultation is by telephone and that they didn't need to come to the [facility].  RN asked if they had access to a computer she said not at this time but she had an Iphone (smartphone).  RN told her that Dr. Tammi Klippel likes for patient to watch a radiation video prior to seeing him if possible just to give them an ideal of what to expect from the appointment.  Radiation video:  [email or web address] provided for them to view over the weekend via Iphone (smartphone).  Reminded Mrs. Altland that this will be a telephone visit and that they do not need to come to the Cancer center 11/05/2022.  RN verified that the number  [phone] was a good number to call for telephone visit and she replied yes.  No further questions/concerns offered at this time.  She was appreciative and thanked nurse for answering questions.

## 2022-11-01 NOTE — Telephone Encounter (Signed)
Called and spoke with patient. Patient stated that he wanted to see if there was another alternative he could use instead of the trelegy. Patient also stated that if there is no alternative of trelegy he will stick with the trelegy. He also mentioned a gum that Dr. Erin Fulling had told him about to help him with smoking and he was wanting the name of that gum as well.   JD, please advise.

## 2022-11-03 ENCOUNTER — Other Ambulatory Visit: Payer: Self-pay | Admitting: Internal Medicine

## 2022-11-03 DIAGNOSIS — E559 Vitamin D deficiency, unspecified: Secondary | ICD-10-CM

## 2022-11-05 ENCOUNTER — Ambulatory Visit
Admission: RE | Admit: 2022-11-05 | Discharge: 2022-11-05 | Disposition: A | Discharge status: Home / Self Care | Payer: Medicare HMO | Source: Ambulatory Visit | Attending: Radiation Oncology | Admitting: Radiation Oncology

## 2022-11-05 ENCOUNTER — Other Ambulatory Visit: Payer: Self-pay

## 2022-11-05 VITALS — Ht 65.0 in | Wt 122.0 lb

## 2022-11-05 DIAGNOSIS — C61 Malignant neoplasm of prostate: Secondary | ICD-10-CM | POA: Diagnosis not present

## 2022-11-05 NOTE — Progress Notes (Signed)
RN left voicemail for call back to access any barriers or navigation needs prior to starting treatment.

## 2022-11-05 NOTE — Progress Notes (Signed)
GU Location of Tumor / Histology: Prostate Ca  If Prostate Cancer, Gleason Score is (3 + 4) and PSA is (4.8 on 05/2022)  Biopsies      Past/Anticipated interventions by urology, if any:     Past/Anticipated interventions by medical oncology, if any:  NA  Weight changes, if any:  Loss of 2-3 lbs.  IPSS:  12 SHIM: 1  Bowel/Bladder complaints, if any:  Constipation sometimes takes stool softeners as needed.  Weak urinary stream and frequency.  Nausea/Vomiting, if any: No  Pain issues, if any:  0/10  SAFETY ISSUES: Prior radiation?  No, given radiation website to view prior to visit. Pacemaker/ICD? No Possible current pregnancy? Male Is the patient on methotrexate? No  Current Complaints / other details:

## 2022-11-05 NOTE — Progress Notes (Signed)
Radiation Oncology         [phone] ________________________________  Initial Outpatient Consultation - Conducted via telephone due to current COVID-19 concerns for limiting patient exposure  Name: Keith [name] MRN: [id number]  Date: 11/05/2022  DOB: 1951-06-15  [id number], Arvid Right, MD  Festus Aloe, MD   REFERRING PHYSICIAN: Festus Aloe, MD  DIAGNOSIS: 72 y.o. gentleman with Stage T1c adenocarcinoma of the prostate with Gleason score of 3+4, and PSA of 4.84.    ICD-10-CM   1. Malignant neoplasm of prostate ([place])  C61       HISTORY OF PRESENT ILLNESS: Keith [name] is a 72 y.o. male with a diagnosis of prostate cancer. He was initially referred to Dr. Junious Silk in 2017 for a rising PSA of 3.96. This subsequently dropped to 2.69, and he continued follow up with his PCP, Dr. Ronnald Ramp. His PSA was again found to be up, to 6.1 in 03/2021, but again dropped to 3.8 by 10/2021. He also underwent prostate MRI on 07/29/21, which was negative for lesions. At routine follow up in 05/2022, he was again found to have a rising PSA, now 4.84. Digital rectal examination was performed at that time showing no nodules.  The patient proceeded to transrectal ultrasound with 12 biopsies of the prostate on 08/27/22.  The prostate volume measured 14.86 cc.  Out of 12 core biopsies, 6 were positive.  The maximum Gleason score was 3+4, and this was seen in right base (with perineural invasion) and right mid. Additionally, small foci of Gleason 3+3 were seen in right apex lateral, right base lateral (with PNI), left mid, and left mid lateral.  The patient reviewed the biopsy results with his urologist and he has kindly been referred today for discussion of potential radiation treatment options.   PREVIOUS RADIATION THERAPY: No  PAST MEDICAL HISTORY:  Past Medical History:  Diagnosis Date   Chicken pox    COPD (chronic obstructive pulmonary disease) ([place])    patient denies   Hyperlipidemia     on meds   Hypertension       PAST SURGICAL HISTORY: Past Surgical History:  Procedure Laterality Date   COLONOSCOPY  2009   Dr.Orr-F/V-prep unknown-tortuous sigm colon-    FAMILY HISTORY:  Family History  Problem Relation Age of Onset   Hypertension Mother    Heart attack Mother        No details   Hypertension Father    Heart attack Father        No details   Heart attack Sister        No details   Pancreatic cancer Brother    Alcohol abuse Brother    Alcohol abuse Brother    Hypertension Brother    Lung cancer Brother    Colon polyps Neg Hx    Colon cancer Neg Hx    Esophageal cancer Neg Hx    Stomach cancer Neg Hx    Rectal cancer Neg Hx     SOCIAL HISTORY:  Social History   Socioeconomic History   Marital status: Married    Spouse name: Not on file   Number of children: 2   Years of education: Not on file   Highest education level: Not on file  Occupational History   Not on file  Tobacco Use   Smoking status: Former    Packs/day: 0.50    Years: 53.00    Total pack years: 26.50    Types: Cigarettes    Start date: 02/15/1969  Quit date: 07/04/2022    Years since quitting: 0.3   Smokeless tobacco: Never   Tobacco comments:    Smokes about 3-4 cigarettes per day  Vaping Use   Vaping Use: Never used  Substance and Sexual Activity   Alcohol use: Not Currently    Alcohol/week: 2.0 standard drinks of alcohol    Types: 2 Standard drinks or equivalent per week    Comment: Patient quit drinking 03/2021   Drug use: Never   Sexual activity: Not Currently    Partners: Female  Other Topics Concern   Not on file  Social History Narrative   Lives with wife, retired.  2 children and one grand.     Lives in a one story    Right Handed, but patient can use left    Social Determinants of Health   Financial Resource Strain: Low Risk  (09/20/2022)   Overall Financial Resource Strain (CARDIA)    Difficulty of Paying Living Expenses: Not hard at all  Food  Insecurity: No Food Insecurity (09/20/2022)   Hunger Vital Sign    Worried About Running Out of Food in the Last Year: Never true    Ran Out of Food in the Last Year: Never true  Transportation Needs: No Transportation Needs (09/20/2022)   PRAPARE - Hydrologist (Medical): No    Lack of Transportation (Non-Medical): No  Physical Activity: Not on file  Stress: No Stress Concern Present (09/20/2022)   Justin    Feeling of Stress : Not at all  Social Connections: [place] (09/20/2022)   Social Connection and Isolation Panel [NHANES]    Frequency of Communication with Friends and Family: More than three times a week    Frequency of Social Gatherings with Friends and Family: More than three times a week    Attends Religious Services: More than 4 times per year    Active Member of [employer] or Organizations: Yes    Attends Music therapist: More than 4 times per year    Marital Status: Married  Human resources officer Violence: Not At Risk (09/20/2022)   Humiliation, Afraid, Rape, and Kick questionnaire    Fear of Current or Ex-Partner: No    Emotionally Abused: No    Physically Abused: No    Sexually Abused: No    ALLERGIES: Patient has no known allergies.  MEDICATIONS:  Current Outpatient Medications  Medication Sig Dispense Refill   tretinoin (RETIN-A) 0.1 % cream 1 application a pearl-sized amount to face in the evening Externally Once a day for 30 days     cyclobenzaprine (FLEXERIL) 5 MG tablet Take 1 tablet (5 mg total) by mouth 3 (three) times daily as needed. (Patient not taking: Reported on 11/05/2022) 40 tablet 1   fluconazole (DIFLUCAN) 200 MG tablet Take 200 mg by mouth once a week.     Fluticasone-Umeclidin-Vilant (TRELEGY ELLIPTA) 100-62.5-25 MCG/ACT AEPB Inhale 1 puff into the lungs daily. 2 each 0   ketoconazole (NIZORAL) 2 % shampoo Apply topically 3 (three) times a  week.     magnesium oxide (MAG-OX) 400 (240 Mg) MG tablet TAKE 1 TABLET EVERY DAY 90 tablet 0   Multiple Vitamins-Minerals (SENTRY ADULT PO) Sentry     mupirocin ointment (BACTROBAN) 2 % Apply topically 2 (two) times daily.     rosuvastatin (CRESTOR) 5 MG tablet TAKE 1 TABLET EVERY DAY 90 tablet 1   tadalafil (CIALIS) 5 MG tablet Take  5 mg by mouth as needed.     tamsulosin (FLOMAX) 0.4 MG CAPS capsule Take 0.4 mg by mouth.     triamcinolone cream (KENALOG) 0.1 % Apply topically 2 (two) times daily.     VITAMIN D3 1.25 MG (50000 UT) capsule TAKE 1 CAPSULE EVERY WEEK 4 capsule 11   zinc gluconate 50 MG tablet Take 1 tablet (50 mg total) by mouth daily. 90 tablet 1   No current facility-administered medications for this encounter.    REVIEW OF SYSTEMS:  On review of systems, the patient reports that he is doing well overall. He denies any chest pain, shortness of breath, cough, fevers, chills, night sweats, unintended weight changes. He denies any bowel disturbances, and denies abdominal pain, nausea or vomiting. He denies any new musculoskeletal or joint aches or pains. His IPSS was 12, indicating moderate urinary symptoms. His SHIM was 1, indicating he has severe erectile dysfunction. A complete review of systems is obtained and is otherwise negative.    PHYSICAL EXAM:  Wt Readings from Last 3 Encounters:  11/05/22 122 lb (55.3 kg)  10/15/22 120 lb 3.2 oz (54.5 kg)  09/20/22 123 lb 3.2 oz (55.9 kg)   Temp Readings from Last 3 Encounters:  09/20/22 (!) 97.3 F (36.3 C)  07/24/22 97.9 F (36.6 C) (Oral)  07/10/22 98.2 F (36.8 C) (Oral)   BP Readings from Last 3 Encounters:  10/15/22 118/80  09/20/22 124/60  08/14/22 (!) 150/80   Pulse Readings from Last 3 Encounters:  10/15/22 61  09/20/22 66  08/14/22 (!) 59    /10  In general this is a well appearing male in no acute distress. He's alert and oriented x4 and appropriate throughout the examination. Cardiopulmonary assessment  is negative for acute distress, and he exhibits normal effort.     KPS = 90  100 - Normal; no complaints; no evidence of disease. 90   - Able to carry on normal activity; minor signs or symptoms of disease. 80   - Normal activity with effort; some signs or symptoms of disease. 76   - Cares for self; unable to carry on normal activity or to do active work. 60   - Requires occasional assistance, but is able to care for most of his personal needs. 50   - Requires considerable assistance and frequent medical care. 44   - Disabled; requires special care and assistance. 85   - Severely disabled; hospital admission is indicated although death not imminent. 50   - Very sick; hospital admission necessary; active supportive treatment necessary. 10   - Moribund; fatal processes progressing rapidly. 0     - Dead  Karnofsky DA, Abelmann [place], Craver LS and Burchenal [facility] [phone]) The use of the nitrogen mustards in the palliative treatment of carcinoma: with particular reference to bronchogenic carcinoma Cancer 1 [id number]  LABORATORY DATA:  Lab Results  Component Value Date   WBC 3.4 (L) 03/22/2022   HGB 11.3 (L) 03/22/2022   HCT 34.4 (L) 03/22/2022   MCV 86.4 03/22/2022   PLT 270.0 03/22/2022   Lab Results  Component Value Date   NA 140 03/22/2022   K 4.1 03/22/2022   CL 104 03/22/2022   CO2 31 03/22/2022   Lab Results  Component Value Date   ALT 11 03/22/2022   AST 20 03/22/2022   ALKPHOS 67 03/22/2022   BILITOT 0.3 03/22/2022     RADIOGRAPHY: CT CHEST LUNG CA SCREEN LOW DOSE W/O CM  Result Date: 10/11/2022  CLINICAL DATA:  Lung cancer screening. Current asymptomatic smoker. Twenty-four pack-year history. EXAM: CT CHEST WITHOUT CONTRAST LOW-DOSE FOR LUNG CANCER SCREENING TECHNIQUE: Multidetector CT imaging of the chest was performed following the standard protocol without IV contrast. RADIATION DOSE REDUCTION: This exam was performed according to the departmental dose-optimization program  which includes automated exposure control, adjustment of the mA and/or kV according to patient size and/or use of iterative reconstruction technique. COMPARISON:  06/02/2021 FINDINGS: Cardiovascular: Normal heart size. No pericardial effusion. Aortic atherosclerosis. RCA coronary artery calcification. Mediastinum/Nodes: No enlarged mediastinal, hilar, or axillary lymph nodes. Thyroid gland, trachea, and esophagus demonstrate no significant findings. Lungs/Pleura: Moderate changes of emphysema. No pleural fluid or airspace disease. Small scattered calcified and noncalcified lung nodules appear unchanged from the previous exam. The largest nodule is in the apical segment of the right upper lobe with a mean derived diameter of 6.3 mm. Upper Abdomen: No acute abnormality. Upper pole left kidney stone measuring 3 mm. Musculoskeletal: No chest wall mass or suspicious bone lesions identified. IMPRESSION: 1. Lung-RADS 2, benign appearance or behavior. Continue annual screening with low-dose chest CT without contrast in 12 months. 2. RCA coronary artery calcification. 3. Nonobstructing left renal calculus. 4. Aortic Atherosclerosis (ICD10-I70.0) and Emphysema (ICD10-J43.9). Electronically Signed   By: Kerby Moors M.D.   On: 10/11/2022 10:43     IMPRESSION/PLAN: This visit was conducted via telephone to spare the patient unnecessary potential exposure in the healthcare setting during the current COVID-19 pandemic. 1. 72 y.o. gentleman with Stage T1c adenocarcinoma of the prostate with Gleason Score of 3+4, and PSA of 4.84. We discussed the patient's workup and outlined the nature of prostate cancer in this setting. The patient's T stage, Gleason's score, and PSA put him into the favorable intermediate risk group. Accordingly, he is eligible for a couple of potential treatment options including 5.5 weeks of external radiation or prostatectomy. His prostate is less than 15 cc, so he is not a candidate for brachytherapy.  We discussed the available radiation techniques, and focused on the details and logistics of delivery. We discussed and outlined the risks, benefits, short and long-term effects associated with radiotherapy and compared and contrasted these with prostatectomy. We discussed the role of SpaceOAR gel in reducing the rectal toxicity associated with radiotherapy. He appears to have a good understanding of his disease and our treatment recommendations which are of curative intent.  He was encouraged to ask questions that were answered to his stated satisfaction.  At the conclusion of our conversation, the patient is interested in moving forward with external beam radiation therapy. We will send patient back to Dr. Junious Silk for fiducial marker and SpaceOAR gel placement. Patient will be scheduled back for CT simulation following this procedure. He will start radiation therapy approximately 1 week after CT simulation.   Given current concerns for patient exposure during the COVID-19 pandemic, this encounter was conducted via telephone. The patient was notified in advance and was offered a [place] meeting to allow for face to face communication but unfortunately reported that he did not have the appropriate resources/technology to support such a visit and instead preferred to proceed with telephone consult. The patient has given verbal consent for this type of encounter. The time spent during this encounter was 60 minutes. The attendants for this meeting include Tyler Pita MD, Leona Singleton PA-C, patient Keith [name] and his wife. During the encounter, Tyler Pita MD and Leona Singleton PA-C were located at [facility] Radiation Oncology Department.  Patient Keith [name] and his wife were located at home.     Leona Singleton, PA-C    Tyler Pita, MD  [facility] Direct Dial: [phone]  Fax: [phone] [email or web address]  Skype  [email or web address]   This document  serves as a record of services personally performed by Tyler Pita, MD and Leona Singleton, PA-C. It was created on their behalf by Wilburn Mylar, a trained medical scribe. The creation of this record is based on the scribe's personal observations and the provider's statements to them. This document has been checked and approved by the attending provider.

## 2022-11-06 ENCOUNTER — Other Ambulatory Visit: Payer: Self-pay | Admitting: Urology

## 2022-11-06 DIAGNOSIS — C61 Malignant neoplasm of prostate: Secondary | ICD-10-CM | POA: Diagnosis not present

## 2022-11-06 DIAGNOSIS — Z191 Hormone sensitive malignancy status: Secondary | ICD-10-CM | POA: Diagnosis not present

## 2022-11-06 DIAGNOSIS — I7300 Raynaud's syndrome without gangrene: Secondary | ICD-10-CM | POA: Diagnosis not present

## 2022-11-06 DIAGNOSIS — L700 Acne vulgaris: Secondary | ICD-10-CM | POA: Diagnosis not present

## 2022-11-06 DIAGNOSIS — L299 Pruritus, unspecified: Secondary | ICD-10-CM | POA: Diagnosis not present

## 2022-11-06 DIAGNOSIS — L739 Follicular disorder, unspecified: Secondary | ICD-10-CM | POA: Diagnosis not present

## 2022-11-06 DIAGNOSIS — L281 Prurigo nodularis: Secondary | ICD-10-CM | POA: Diagnosis not present

## 2022-11-09 NOTE — Progress Notes (Signed)
Called and introduced myself to the patient as the prostate nurse navigator.  No barriers to care identified at this time.  He had a telephone [place] on 2/19 to discuss his radiation treatment options, and has decided to proceed with 5.5 weeks of radiation.  I gave him mydirect number and encouraged him to call with any questions or concerns.  Verbalized understanding.

## 2022-11-13 ENCOUNTER — Encounter (HOSPITAL_BASED_OUTPATIENT_CLINIC_OR_DEPARTMENT_OTHER): Payer: Self-pay | Admitting: Urology

## 2022-11-13 NOTE — Progress Notes (Signed)
Spoke w/ via phone for pre-op interview--- Keith [name] needs dos----NONE               [name] results------Current EKG in Epic dated 03/2022 COVID test -----patient states asymptomatic no test needed Arrive at -------0730 NPO after MN NO Solid Food.   Med rec completed Medications to take morning of surgery -----Trellegy inhaler Diabetic medication ----- Patient instructed no nail polish to be worn day of surgery Patient instructed to bring photo id and insurance card day of surgery Patient aware to have Driver (ride ) / caregiver  [name]  for 24 hours after surgery  Patient Special Instructions ----- Pre-Op special Istructions -----FLEETS enema AM of procedure. Verbalized understanding. Patient verbalized understanding of instructions that were given at this phone interview. Patient denies shortness of breath, chest pain, fever, cough at this phone interview.

## 2022-11-15 NOTE — Progress Notes (Signed)
Called and spoke w/ pt via phone.  Pt verbalized understanding of surgery start time change and new arrival time at 0630 not 0730 .  Pt had not further questions.

## 2022-11-19 NOTE — H&P (Signed)
Urology Admission H&P  Chief Complaint: prostate cancer  History of Present Illness: Keith [name] is a 72 y.o. male with a diagnosis of prostate cancer. He was initially referred to Dr. Junious Silk in 2017 for a rising PSA of 3.96. This subsequently dropped to 2.69, and he continued follow up with his PCP, Dr. Ronnald Ramp. His PSA was again found to be up, to 6.1 in 03/2021, but again dropped to 3.8 by 10/2021. He also underwent prostate MRI on 07/29/21, which was negative for lesions. At routine follow up in 05/2022, he was again found to have a rising PSA, now 4.84. Digital rectal examination was performed at that time showing no nodules.  The patient proceeded to transrectal ultrasound with 12 biopsies of the prostate on 08/27/22.  The prostate volume measured 14.86 cc.  Out of 12 core biopsies, 6 were positive.  The maximum Gleason score was 3+4, and this was seen in right base (with perineural invasion) and right mid. Additionally, small foci of Gleason 3+3 were seen in right apex lateral, right base lateral (with PNI), left mid, and left mid lateral. Patient has elected for IMRT and now presents for fiducial gold markers/Spaceoar gel implant  Past Medical History:  Diagnosis Date   Cancer ([place])    Chicken pox    COPD (chronic obstructive pulmonary disease) ([place])    patient denies   Hyperlipidemia    on meds   Hypertension    Past Surgical History:  Procedure Laterality Date   COLONOSCOPY  2009   Dr.Orr-F/V-prep unknown-tortuous sigm colon-    Home Medications:  No current facility-administered medications for this encounter.   Current Outpatient Medications  Medication Sig Dispense Refill Last Dose   fluconazole (DIFLUCAN) 200 MG tablet Take 200 mg by mouth once a week.   Past Week   Fluticasone-Umeclidin-Vilant (TRELEGY ELLIPTA) 100-62.5-25 MCG/ACT AEPB Inhale 1 puff into the lungs daily. 2 each 0 11/13/2022   ketoconazole (NIZORAL) 2 % shampoo Apply topically 3 (three) times a week.    Past Week   magnesium oxide (MAG-OX) 400 (240 Mg) MG tablet TAKE 1 TABLET EVERY DAY 90 tablet 0 11/13/2022   Multiple Vitamins-Minerals (SENTRY ADULT PO) Sentry   11/13/2022   mupirocin ointment (BACTROBAN) 2 % Apply topically 2 (two) times daily.   11/13/2022   rosuvastatin (CRESTOR) 5 MG tablet TAKE 1 TABLET EVERY DAY 90 tablet 1 11/13/2022   tamsulosin (FLOMAX) 0.4 MG CAPS capsule Take 0.4 mg by mouth.   11/13/2022   triamcinolone cream (KENALOG) 0.1 % Apply topically 2 (two) times daily.   11/13/2022   VITAMIN D3 1.25 MG (50000 UT) capsule TAKE 1 CAPSULE EVERY WEEK 4 capsule 11 Past Week   zinc gluconate 50 MG tablet Take 1 tablet (50 mg total) by mouth daily. 90 tablet 1 11/13/2022   cyclobenzaprine (FLEXERIL) 5 MG tablet Take 1 tablet (5 mg total) by mouth 3 (three) times daily as needed. (Patient not taking: Reported on 11/05/2022) 40 tablet 1    tadalafil (CIALIS) 5 MG tablet Take 5 mg by mouth as needed.      tretinoin (RETIN-A) 0.1 % cream 1 application a pearl-sized amount to face in the evening Externally Once a day for 30 days      Allergies: No Known Allergies  Family History  Problem Relation Age of Onset   Hypertension Mother    Heart attack Mother        No details   Hypertension Father    Heart attack Father  No details   Heart attack Sister        No details   Pancreatic cancer Brother    Alcohol abuse Brother    Alcohol abuse Brother    Hypertension Brother    Lung cancer Brother    Colon polyps Neg Hx    Colon cancer Neg Hx    Esophageal cancer Neg Hx    Stomach cancer Neg Hx    Rectal cancer Neg Hx    Social History:  reports that he quit smoking about 4 months ago. His smoking use included cigarettes. He started smoking about 53 years ago. He has a 26.50 pack-year smoking history. He has never used smokeless tobacco. He reports that he does not currently use alcohol after a past usage of about 2.0 standard drinks of alcohol per week. He reports that he does  not use drugs.  Review of Systems  Physical Exam:  Vital signs in last 24 hours:   Physical Exam Hear RRR Lung CTA ABD sof nt GU No CVAT  Laboratory Data:  No results found for this or any previous visit (from the past 24 hour(s)). No results found for this or any previous visit (from the past 240 hour(s)). Creatinine: No results for input(s): "CREATININE" in the last 168 hours. Baseline Creatinine:   Impression/Assessment:  Prostate cancer  Plan:  Scheduled for gold fiducial markers and space oar gel implant.Risks/benefits discussed with patient  [name] 11/19/2022, 4:50 PM

## 2022-11-19 NOTE — Anesthesia Preprocedure Evaluation (Addendum)
Anesthesia Evaluation  Patient identified by MRN, date of birth, ID band Patient awake    Reviewed: Allergy & Precautions, NPO status , Patient's Chart, lab work & pertinent test results  Airway Mallampati: II  TM Distance: >3 FB Neck ROM: Full    Dental no notable dental hx. (+) Upper Dentures, Edentulous Lower   Pulmonary COPD, former smoker   Pulmonary exam normal breath sounds clear to auscultation       Cardiovascular hypertension, Normal cardiovascular exam Rhythm:Regular Rate:Normal     Neuro/Psych    GI/Hepatic ,GERD  ,,  Endo/Other    Renal/GU    Prostate Ca    Musculoskeletal   Abdominal   Peds  Hematology   Anesthesia Other Findings   Reproductive/Obstetrics                             Anesthesia Physical Anesthesia Plan  ASA: 3  Anesthesia Plan: MAC   Post-op Pain Management: Tylenol PO (pre-op)* and Precedex   Induction: Intravenous  PONV Risk Score and Plan: Treatment may vary due to age or medical condition, Midazolam and Propofol infusion  Airway Management Planned: Natural Airway and Nasal Cannula  Additional Equipment: None  Intra-op Plan:   Post-operative Plan:   Informed Consent: I have reviewed the patients History and Physical, chart, labs and discussed the procedure including the risks, benefits and alternatives for the proposed anesthesia with the patient or authorized representative who has indicated his/her understanding and acceptance.     Dental advisory given  Plan Discussed with: CRNA  Anesthesia Plan Comments:        Anesthesia Quick Evaluation

## 2022-11-20 ENCOUNTER — Ambulatory Visit (HOSPITAL_BASED_OUTPATIENT_CLINIC_OR_DEPARTMENT_OTHER): Payer: Medicare HMO | Admitting: Certified Registered Nurse Anesthetist

## 2022-11-20 ENCOUNTER — Ambulatory Visit (HOSPITAL_BASED_OUTPATIENT_CLINIC_OR_DEPARTMENT_OTHER)
Admission: RE | Admit: 2022-11-20 | Discharge: 2022-11-20 | Disposition: A | Discharge status: Home / Self Care | Payer: Medicare HMO | Attending: Urology | Admitting: Urology

## 2022-11-20 ENCOUNTER — Encounter (HOSPITAL_BASED_OUTPATIENT_CLINIC_OR_DEPARTMENT_OTHER): Payer: Self-pay | Admitting: Urology

## 2022-11-20 ENCOUNTER — Encounter (HOSPITAL_BASED_OUTPATIENT_CLINIC_OR_DEPARTMENT_OTHER)
Admission: RE | Disposition: A | Discharge status: Home / Self Care | Payer: Self-pay | Source: Home / Self Care | Attending: Urology

## 2022-11-20 DIAGNOSIS — C61 Malignant neoplasm of prostate: Secondary | ICD-10-CM | POA: Insufficient documentation

## 2022-11-20 DIAGNOSIS — J449 Chronic obstructive pulmonary disease, unspecified: Secondary | ICD-10-CM | POA: Diagnosis not present

## 2022-11-20 DIAGNOSIS — Z87891 Personal history of nicotine dependence: Secondary | ICD-10-CM

## 2022-11-20 DIAGNOSIS — I10 Essential (primary) hypertension: Secondary | ICD-10-CM

## 2022-11-20 HISTORY — DX: Malignant (primary) neoplasm, unspecified: C80.1

## 2022-11-20 HISTORY — PX: GOLD SEED IMPLANT: SHX6343

## 2022-11-20 HISTORY — PX: SPACE OAR INSTILLATION: SHX6769

## 2022-11-20 SURGERY — INSERTION, GOLD SEEDS
Anesthesia: Monitor Anesthesia Care

## 2022-11-20 MED ORDER — SODIUM CHLORIDE (PF) 0.9 % IJ SOLN
INTRAMUSCULAR | Status: DC | PRN
  Administered 2022-11-20: 10 mL

## 2022-11-20 MED ORDER — PROPOFOL 500 MG/50ML IV EMUL
INTRAVENOUS | Status: AC
  Filled 2022-11-20: qty 50

## 2022-11-20 MED ORDER — FLEET ENEMA 7-19 GM/118ML RE ENEM: 1.0000 | ENEMA | Freq: Once | RECTAL | Status: DC

## 2022-11-20 MED ORDER — LACTATED RINGERS IV SOLN: INTRAVENOUS | Status: DC

## 2022-11-20 MED ORDER — CEFAZOLIN SODIUM-DEXTROSE 2-4 GM/100ML-% IV SOLN
2.0000 g | INTRAVENOUS | Status: AC
  Administered 2022-11-20: 2 g via INTRAVENOUS

## 2022-11-20 MED ORDER — FENTANYL CITRATE (PF) 100 MCG/2ML IJ SOLN
INTRAMUSCULAR | Status: DC | PRN
  Administered 2022-11-20 (×4): 25 ug via INTRAVENOUS

## 2022-11-20 MED ORDER — CEFAZOLIN SODIUM-DEXTROSE 2-4 GM/100ML-% IV SOLN
INTRAVENOUS | Status: AC
  Filled 2022-11-20: qty 100

## 2022-11-20 MED ORDER — LIDOCAINE HCL 1 % IJ SOLN
INTRAMUSCULAR | Status: DC | PRN
  Administered 2022-11-20: 8 mL

## 2022-11-20 MED ORDER — FENTANYL CITRATE (PF) 100 MCG/2ML IJ SOLN: 25.0000 ug | INTRAMUSCULAR | Status: DC | PRN

## 2022-11-20 MED ORDER — ONDANSETRON HCL 4 MG/2ML IJ SOLN
INTRAMUSCULAR | Status: DC | PRN
  Administered 2022-11-20: 4 mg via INTRAVENOUS

## 2022-11-20 MED ORDER — PROPOFOL 10 MG/ML IV BOLUS
INTRAVENOUS | Status: AC
  Filled 2022-11-20: qty 20

## 2022-11-20 MED ORDER — EPHEDRINE SULFATE (PRESSORS) 50 MG/ML IJ SOLN
INTRAMUSCULAR | Status: DC | PRN
  Administered 2022-11-20: 10 mg via INTRAVENOUS
  Administered 2022-11-20: 5 mg via INTRAVENOUS

## 2022-11-20 MED ORDER — PROPOFOL 500 MG/50ML IV EMUL
INTRAVENOUS | Status: DC | PRN
  Administered 2022-11-20: 50 ug/kg/min via INTRAVENOUS

## 2022-11-20 MED ORDER — ACETAMINOPHEN 10 MG/ML IV SOLN: 1000.0000 mg | Freq: Once | INTRAVENOUS | Status: DC | PRN

## 2022-11-20 MED ORDER — FENTANYL CITRATE (PF) 100 MCG/2ML IJ SOLN
INTRAMUSCULAR | Status: AC
  Filled 2022-11-20: qty 2

## 2022-11-20 MED ORDER — ONDANSETRON HCL 4 MG/2ML IJ SOLN: 4.0000 mg | Freq: Once | INTRAMUSCULAR | Status: DC | PRN

## 2022-11-20 MED ORDER — ONDANSETRON HCL 4 MG/2ML IJ SOLN
INTRAMUSCULAR | Status: AC
  Filled 2022-11-20: qty 2

## 2022-11-20 SURGICAL SUPPLY — 26 items
BLADE CLIPPER SENSICLIP SURGIC (BLADE) ×1 IMPLANT
CNTNR URN SCR LID CUP LEK RST (MISCELLANEOUS) ×1 IMPLANT
CONT SPEC 4OZ STRL OR WHT (MISCELLANEOUS) ×1
COVER BACK TABLE 60X90IN (DRAPES) ×1 IMPLANT
DRSG TEGADERM 4X4.75 (GAUZE/BANDAGES/DRESSINGS) ×1 IMPLANT
DRSG TEGADERM 8X12 (GAUZE/BANDAGES/DRESSINGS) ×1 IMPLANT
GAUZE SPONGE 4X4 12PLY STRL (GAUZE/BANDAGES/DRESSINGS) ×1 IMPLANT
GLOVE BIO SURGEON STRL SZ7.5 (GLOVE) ×1 IMPLANT
GLOVE ECLIPSE 8.0 STRL XLNG CF (GLOVE) ×1 IMPLANT
GLOVE SURG ORTHO 8.5 STRL (GLOVE) ×1 IMPLANT
IMPL SPACEOAR VUE SYSTEM (Spacer) ×1 IMPLANT
IMPLANT SPACEOAR VUE SYSTEM (Spacer) ×1 IMPLANT
KIT TURNOVER CYSTO (KITS) ×1 IMPLANT
MARKER GOLD PRELOAD 1.2X3 (Urological Implant) ×1 IMPLANT
MARKER SKIN DUAL TIP RULER LAB (MISCELLANEOUS) ×1 IMPLANT
NDL SPNL 22GX3.5 QUINCKE BK (NEEDLE) ×1 IMPLANT
NEEDLE SPNL 22GX3.5 QUINCKE BK (NEEDLE) ×1 IMPLANT
SEED GOLD PRELOAD 1.2X3 (Urological Implant) ×1 IMPLANT
SHEATH ULTRASOUND LF (SHEATH) IMPLANT
SHEATH ULTRASOUND LTX NONSTRL (SHEATH) IMPLANT
SLEEVE SCD COMPRESS KNEE MED (STOCKING) ×1 IMPLANT
SURGILUBE 2OZ TUBE FLIPTOP (MISCELLANEOUS) ×1 IMPLANT
SYR 10ML LL (SYRINGE) IMPLANT
SYR CONTROL 10ML LL (SYRINGE) ×1 IMPLANT
TOWEL OR 17X24 6PK STRL BLUE (TOWEL DISPOSABLE) ×1 IMPLANT
UNDERPAD 30X36 HEAVY ABSORB (UNDERPADS AND DIAPERS) ×1 IMPLANT

## 2022-11-20 NOTE — Op Note (Signed)
Preoperative diagnosis: Clinically localized adenocarcinoma of the prostate  Postoperative diagnosis: Clinically localized adenocarcinoma of the prostate  Procedure: 1) Placement of fiducial markers into prostate                    2) Insertion of SpaceOAR hydrogel   Surgeon: [name], MD  Radiation oncologist: Tammi Klippel  Anesthesia: General  EBL: Minimal  Complications: None  Indication: Keith [name] is a 72 y.o. gentleman with clinically localized prostate cancer. After discussing management options for treatment, he elected to proceed with radiotherapy. He presents today for the above procedures. The potential risks, complications, alternative options, and expected recovery course have been discussed in detail with the patient and he has provided informed consent to proceed.  Description of procedure: The patient was administered preoperative antibiotics, placed in the dorsal lithotomy position, and prepped and draped in the usual sterile fashion. Next, transrectal ultrasonography was utilized to visualize the prostate. Three gold fiducial markers were then placed into the prostate via transperineal needles under ultrasound guidance at the left apex, left base, and right mid gland under direct ultrasound guidance. A site in the midline was then selected on the perineum for placement of an 18 g needle with saline. The needle was advanced above the rectum and below Denonvillier's fascia to the mid gland and confirmed to be in the midline on transverse imaging. One cc of saline was injected confirming appropriate expansion of this space. A total of 5 cc of saline was then injected to open the space further bilaterally. The saline syringe was then removed and the SpaceOAR hydrogel was injected with good distribution bilaterally. He tolerated the procedure well and without complications. He was given a voiding trial prior to discharge from the PACU.  [name], MD

## 2022-11-20 NOTE — Discharge Instructions (Signed)

## 2022-11-20 NOTE — Anesthesia Postprocedure Evaluation (Signed)
Anesthesia Post Note  Patient: Keith [name]  Procedure(s) Performed: GOLD SEED IMPLANT SPACE OAR INSTILLATION     Patient location during evaluation: PACU Anesthesia Type: MAC Level of consciousness: awake and alert Pain management: pain level controlled Vital Signs Assessment: post-procedure vital signs reviewed and stable Respiratory status: spontaneous breathing, nonlabored ventilation, respiratory function stable and patient connected to nasal cannula oxygen Cardiovascular status: stable and blood pressure returned to baseline Postop Assessment: no apparent nausea or vomiting Anesthetic complications: no  No notable events documented.  Last Vitals:  Vitals:   11/20/22 1010 11/20/22 1040  BP:    Pulse: (!) 49 71  Resp: 12 18  Temp: (!) 36.3 C   SpO2: 99% 97%    Last Pain:  Vitals:   11/20/22 1040  TempSrc:   PainSc: 0-No pain                 Barnet Glasgow

## 2022-11-20 NOTE — Interval H&P Note (Signed)
History and Physical Interval Note:  11/20/2022 7:19 AM  Keith [name]  has presented today for surgery, with the diagnosis of PROSTATE CANCER.  The various methods of treatment have been discussed with the patient and family. After consideration of risks, benefits and other options for treatment, the patient has consented to  Procedure(s) with comments: GOLD SEED IMPLANT (N/A) - 30 MINS SPACE OAR INSTILLATION (N/A) as a surgical intervention.  The patient's history has been reviewed, patient examined, no change in status, stable for surgery.  I have reviewed the patient's chart and labs.  Questions were answered to the patient's satisfaction.     Keith [name]

## 2022-11-20 NOTE — Anesthesia Procedure Notes (Signed)
Procedure Name: MAC Date/Time: 11/20/2022 8:50 AM  Performed by: Justice Rocher, CRNAPre-anesthesia Checklist: Patient identified, Emergency Drugs available, Suction available, Patient being monitored and Timeout performed Patient Re-evaluated:Patient Re-evaluated prior to induction Oxygen Delivery Method: Simple face mask Preoxygenation: Pre-oxygenation with 100% oxygen Induction Type: IV induction Placement Confirmation: breath sounds checked- equal and bilateral, CO2 detector and positive ETCO2

## 2022-11-20 NOTE — Transfer of Care (Signed)
Immediate Anesthesia Transfer of Care Note  Patient: Keith [name]  Procedure(s) Performed: Procedure(s) (LRB): GOLD SEED IMPLANT (N/A) SPACE OAR INSTILLATION (N/A)  Patient Location: PACU  Anesthesia Type: MAC  Level of Consciousness: awake, sedated, patient cooperative and responds to stimulation  Airway & Oxygen Therapy: Patient Spontanous Breathing and Patient connected to Ruth oxygen  Post-op Assessment: Report given to PACU RN, Post -op Vital signs reviewed and stable and Patient moving all extremities  Post vital signs: Reviewed and stable  Complications: No apparent anesthesia complications

## 2022-11-21 ENCOUNTER — Encounter (HOSPITAL_BASED_OUTPATIENT_CLINIC_OR_DEPARTMENT_OTHER): Payer: Self-pay | Admitting: Urology

## 2022-11-27 ENCOUNTER — Telehealth: Payer: Self-pay | Admitting: *Deleted

## 2022-11-27 NOTE — Telephone Encounter (Signed)
CALLED PATIENT TO REMIND OF SIM APPT. FOR 11-29-22- ARRIVAL TIME- 10:45 AM @ [facility], INFORMED PATIENT TO ARRIVE WITH A FULL BLADDER, SPOKE WITH PATIENT AND HE IS AWARE OF THIS APPT. AND THE INSTRUCTIONS

## 2022-11-28 NOTE — Progress Notes (Signed)
  Radiation Oncology         [phone] ________________________________  Name: Keith [name] MRN: [id number]  Date: 11/29/2022  DOB: Nov 05, 1950  SIMULATION AND TREATMENT PLANNING NOTE    ICD-10-CM   1. Malignant neoplasm of prostate ([place])  C61       DIAGNOSIS:  72 y.o. gentleman with Stage T1c adenocarcinoma of the prostate with Gleason score of 3+4, and PSA of 4.84.   NARRATIVE:  The patient was brought to the [facility].  Identity was confirmed.  All relevant records and images related to the planned course of therapy were reviewed.  The patient freely provided informed written consent to proceed with treatment after reviewing the details related to the planned course of therapy. The consent form was witnessed and verified by the simulation staff.  Then, the patient was set-up in a stable reproducible supine position for radiation therapy.  A vacuum lock pillow device was custom fabricated to position his legs in a reproducible immobilized position.  Then, I performed a urethrogram under sterile conditions to identify the prostatic apex.  CT images were obtained.  Surface markings were placed.  The CT images were loaded into the planning software.  Then the prostate target and avoidance structures including the rectum, bladder, bowel and hips were contoured.  Treatment planning then occurred.  The radiation prescription was entered and confirmed.  A total of one complex treatment devices was fabricated. I have requested : Intensity Modulated Radiotherapy (IMRT) is medically necessary for this case for the following reason:  Rectal sparing.Marland Kitchen  PLAN:  The patient will receive 70 Gy in 28 fractions.  ________________________________  Sheral Apley Tammi Klippel, M.D.

## 2022-11-29 ENCOUNTER — Ambulatory Visit
Admission: RE | Admit: 2022-11-29 | Discharge: 2022-11-29 | Disposition: A | Discharge status: Home / Self Care | Payer: Medicare HMO | Source: Ambulatory Visit | Attending: Radiation Oncology | Admitting: Radiation Oncology

## 2022-11-29 DIAGNOSIS — C61 Malignant neoplasm of prostate: Secondary | ICD-10-CM | POA: Insufficient documentation

## 2022-11-29 DIAGNOSIS — Z191 Hormone sensitive malignancy status: Secondary | ICD-10-CM | POA: Diagnosis not present

## 2022-12-05 DIAGNOSIS — C61 Malignant neoplasm of prostate: Secondary | ICD-10-CM | POA: Diagnosis not present

## 2022-12-05 DIAGNOSIS — Z191 Hormone sensitive malignancy status: Secondary | ICD-10-CM | POA: Diagnosis not present

## 2022-12-06 DIAGNOSIS — Z191 Hormone sensitive malignancy status: Secondary | ICD-10-CM | POA: Diagnosis not present

## 2022-12-06 DIAGNOSIS — C61 Malignant neoplasm of prostate: Secondary | ICD-10-CM | POA: Diagnosis not present

## 2022-12-17 ENCOUNTER — Other Ambulatory Visit: Payer: Self-pay

## 2022-12-17 ENCOUNTER — Ambulatory Visit
Admission: RE | Admit: 2022-12-17 | Discharge: 2022-12-17 | Disposition: A | Discharge status: Home / Self Care | Payer: Medicare HMO | Source: Ambulatory Visit | Attending: Radiation Oncology | Admitting: Radiation Oncology

## 2022-12-17 DIAGNOSIS — C61 Malignant neoplasm of prostate: Secondary | ICD-10-CM | POA: Diagnosis not present

## 2022-12-17 DIAGNOSIS — Z510 Encounter for antineoplastic radiation therapy: Secondary | ICD-10-CM | POA: Diagnosis not present

## 2022-12-17 DIAGNOSIS — Z191 Hormone sensitive malignancy status: Secondary | ICD-10-CM | POA: Diagnosis not present

## 2022-12-17 LAB — RAD ONC ARIA SESSION SUMMARY
Course Elapsed Days: 0
Plan Fractions Treated to Date: 1
Plan Prescribed Dose Per Fraction: 2.5 Gy
Plan Total Fractions Prescribed: 28
Plan Total Prescribed Dose: 70 Gy
Reference Point Dosage Given to Date: 2.5 Gy
Reference Point Session Dosage Given: 2.5 Gy
Session Number: 1

## 2022-12-18 ENCOUNTER — Other Ambulatory Visit: Payer: Self-pay

## 2022-12-18 ENCOUNTER — Ambulatory Visit
Admission: RE | Admit: 2022-12-18 | Discharge: 2022-12-18 | Disposition: A | Discharge status: Home / Self Care | Payer: Medicare HMO | Source: Ambulatory Visit | Attending: Radiation Oncology | Admitting: Radiation Oncology

## 2022-12-18 DIAGNOSIS — Z510 Encounter for antineoplastic radiation therapy: Secondary | ICD-10-CM | POA: Diagnosis not present

## 2022-12-18 DIAGNOSIS — C61 Malignant neoplasm of prostate: Secondary | ICD-10-CM | POA: Diagnosis not present

## 2022-12-18 DIAGNOSIS — Z191 Hormone sensitive malignancy status: Secondary | ICD-10-CM | POA: Diagnosis not present

## 2022-12-18 LAB — RAD ONC ARIA SESSION SUMMARY
Course Elapsed Days: 1
Plan Fractions Treated to Date: 2
Plan Prescribed Dose Per Fraction: 2.5 Gy
Plan Total Fractions Prescribed: 28
Plan Total Prescribed Dose: 70 Gy
Reference Point Dosage Given to Date: 5 Gy
Reference Point Session Dosage Given: 2.5 Gy
Session Number: 2

## 2022-12-19 ENCOUNTER — Ambulatory Visit
Admission: RE | Admit: 2022-12-19 | Discharge: 2022-12-19 | Disposition: A | Discharge status: Home / Self Care | Payer: Medicare HMO | Source: Ambulatory Visit | Attending: Radiation Oncology | Admitting: Radiation Oncology

## 2022-12-19 ENCOUNTER — Other Ambulatory Visit: Payer: Self-pay

## 2022-12-19 DIAGNOSIS — C61 Malignant neoplasm of prostate: Secondary | ICD-10-CM | POA: Diagnosis not present

## 2022-12-19 DIAGNOSIS — Z510 Encounter for antineoplastic radiation therapy: Secondary | ICD-10-CM | POA: Diagnosis not present

## 2022-12-19 DIAGNOSIS — Z191 Hormone sensitive malignancy status: Secondary | ICD-10-CM | POA: Diagnosis not present

## 2022-12-19 LAB — RAD ONC ARIA SESSION SUMMARY
Course Elapsed Days: 2
Plan Fractions Treated to Date: 3
Plan Prescribed Dose Per Fraction: 2.5 Gy
Plan Total Fractions Prescribed: 28
Plan Total Prescribed Dose: 70 Gy
Reference Point Dosage Given to Date: 7.5 Gy
Reference Point Session Dosage Given: 2.5 Gy
Session Number: 3

## 2022-12-20 ENCOUNTER — Other Ambulatory Visit: Payer: Self-pay

## 2022-12-20 ENCOUNTER — Ambulatory Visit
Admission: RE | Admit: 2022-12-20 | Discharge: 2022-12-20 | Disposition: A | Discharge status: Home / Self Care | Payer: Medicare HMO | Source: Ambulatory Visit | Attending: Radiation Oncology | Admitting: Radiation Oncology

## 2022-12-20 DIAGNOSIS — Z510 Encounter for antineoplastic radiation therapy: Secondary | ICD-10-CM | POA: Diagnosis not present

## 2022-12-20 DIAGNOSIS — Z191 Hormone sensitive malignancy status: Secondary | ICD-10-CM | POA: Diagnosis not present

## 2022-12-20 DIAGNOSIS — C61 Malignant neoplasm of prostate: Secondary | ICD-10-CM | POA: Diagnosis not present

## 2022-12-20 LAB — RAD ONC ARIA SESSION SUMMARY
Course Elapsed Days: 3
Plan Fractions Treated to Date: 4
Plan Prescribed Dose Per Fraction: 2.5 Gy
Plan Total Fractions Prescribed: 28
Plan Total Prescribed Dose: 70 Gy
Reference Point Dosage Given to Date: 10 Gy
Reference Point Session Dosage Given: 2.5 Gy
Session Number: 4

## 2022-12-21 ENCOUNTER — Other Ambulatory Visit: Payer: Self-pay

## 2022-12-21 ENCOUNTER — Ambulatory Visit
Admission: RE | Admit: 2022-12-21 | Discharge: 2022-12-21 | Disposition: A | Discharge status: Home / Self Care | Payer: Medicare HMO | Source: Ambulatory Visit | Attending: Radiation Oncology | Admitting: Radiation Oncology

## 2022-12-21 DIAGNOSIS — Z191 Hormone sensitive malignancy status: Secondary | ICD-10-CM | POA: Diagnosis not present

## 2022-12-21 DIAGNOSIS — Z510 Encounter for antineoplastic radiation therapy: Secondary | ICD-10-CM | POA: Diagnosis not present

## 2022-12-21 DIAGNOSIS — C61 Malignant neoplasm of prostate: Secondary | ICD-10-CM | POA: Diagnosis not present

## 2022-12-21 LAB — RAD ONC ARIA SESSION SUMMARY
Course Elapsed Days: 4
Plan Fractions Treated to Date: 5
Plan Prescribed Dose Per Fraction: 2.5 Gy
Plan Total Fractions Prescribed: 28
Plan Total Prescribed Dose: 70 Gy
Reference Point Dosage Given to Date: 12.5 Gy
Reference Point Session Dosage Given: 2.5 Gy
Session Number: 5

## 2022-12-24 ENCOUNTER — Ambulatory Visit
Admission: RE | Admit: 2022-12-24 | Discharge: 2022-12-24 | Disposition: A | Discharge status: Home / Self Care | Payer: Medicare HMO | Source: Ambulatory Visit | Attending: Radiation Oncology | Admitting: Radiation Oncology

## 2022-12-24 ENCOUNTER — Other Ambulatory Visit: Payer: Self-pay

## 2022-12-24 DIAGNOSIS — Z191 Hormone sensitive malignancy status: Secondary | ICD-10-CM | POA: Diagnosis not present

## 2022-12-24 DIAGNOSIS — Z510 Encounter for antineoplastic radiation therapy: Secondary | ICD-10-CM | POA: Diagnosis not present

## 2022-12-24 DIAGNOSIS — C61 Malignant neoplasm of prostate: Secondary | ICD-10-CM | POA: Diagnosis not present

## 2022-12-24 LAB — RAD ONC ARIA SESSION SUMMARY
Course Elapsed Days: 7
Plan Fractions Treated to Date: 6
Plan Prescribed Dose Per Fraction: 2.5 Gy
Plan Total Fractions Prescribed: 28
Plan Total Prescribed Dose: 70 Gy
Reference Point Dosage Given to Date: 15 Gy
Reference Point Session Dosage Given: 2.5 Gy
Session Number: 6

## 2022-12-25 ENCOUNTER — Other Ambulatory Visit: Payer: Self-pay

## 2022-12-25 ENCOUNTER — Ambulatory Visit
Admission: RE | Admit: 2022-12-25 | Discharge: 2022-12-25 | Disposition: A | Discharge status: Home / Self Care | Payer: Medicare HMO | Source: Ambulatory Visit | Attending: Radiation Oncology | Admitting: Radiation Oncology

## 2022-12-25 DIAGNOSIS — C61 Malignant neoplasm of prostate: Secondary | ICD-10-CM | POA: Diagnosis not present

## 2022-12-25 DIAGNOSIS — Z510 Encounter for antineoplastic radiation therapy: Secondary | ICD-10-CM | POA: Diagnosis not present

## 2022-12-25 DIAGNOSIS — Z191 Hormone sensitive malignancy status: Secondary | ICD-10-CM | POA: Diagnosis not present

## 2022-12-25 LAB — RAD ONC ARIA SESSION SUMMARY
Course Elapsed Days: 8
Plan Fractions Treated to Date: 7
Plan Prescribed Dose Per Fraction: 2.5 Gy
Plan Total Fractions Prescribed: 28
Plan Total Prescribed Dose: 70 Gy
Reference Point Dosage Given to Date: 17.5 Gy
Reference Point Session Dosage Given: 2.5 Gy
Session Number: 7

## 2022-12-26 ENCOUNTER — Ambulatory Visit
Admission: RE | Admit: 2022-12-26 | Discharge: 2022-12-26 | Disposition: A | Discharge status: Home / Self Care | Payer: Medicare HMO | Source: Ambulatory Visit | Attending: Radiation Oncology | Admitting: Radiation Oncology

## 2022-12-26 ENCOUNTER — Other Ambulatory Visit: Payer: Self-pay

## 2022-12-26 DIAGNOSIS — C61 Malignant neoplasm of prostate: Secondary | ICD-10-CM | POA: Diagnosis not present

## 2022-12-26 DIAGNOSIS — Z510 Encounter for antineoplastic radiation therapy: Secondary | ICD-10-CM | POA: Diagnosis not present

## 2022-12-26 DIAGNOSIS — Z191 Hormone sensitive malignancy status: Secondary | ICD-10-CM | POA: Diagnosis not present

## 2022-12-26 LAB — RAD ONC ARIA SESSION SUMMARY
Course Elapsed Days: 9
Plan Fractions Treated to Date: 8
Plan Prescribed Dose Per Fraction: 2.5 Gy
Plan Total Fractions Prescribed: 28
Plan Total Prescribed Dose: 70 Gy
Reference Point Dosage Given to Date: 20 Gy
Reference Point Session Dosage Given: 2.5 Gy
Session Number: 8

## 2022-12-27 ENCOUNTER — Ambulatory Visit: Payer: Medicare HMO

## 2022-12-27 ENCOUNTER — Ambulatory Visit
Admission: RE | Admit: 2022-12-27 | Discharge: 2022-12-27 | Disposition: A | Discharge status: Home / Self Care | Payer: Medicare HMO | Source: Ambulatory Visit | Attending: Radiation Oncology | Admitting: Radiation Oncology

## 2022-12-27 ENCOUNTER — Other Ambulatory Visit: Payer: Self-pay

## 2022-12-27 DIAGNOSIS — Z510 Encounter for antineoplastic radiation therapy: Secondary | ICD-10-CM | POA: Diagnosis not present

## 2022-12-27 DIAGNOSIS — C61 Malignant neoplasm of prostate: Secondary | ICD-10-CM | POA: Diagnosis not present

## 2022-12-27 DIAGNOSIS — Z191 Hormone sensitive malignancy status: Secondary | ICD-10-CM | POA: Diagnosis not present

## 2022-12-27 LAB — RAD ONC ARIA SESSION SUMMARY
Course Elapsed Days: 10
Plan Fractions Treated to Date: 9
Plan Prescribed Dose Per Fraction: 2.5 Gy
Plan Total Fractions Prescribed: 28
Plan Total Prescribed Dose: 70 Gy
Reference Point Dosage Given to Date: 22.5 Gy
Reference Point Session Dosage Given: 2.5 Gy
Session Number: 9

## 2022-12-28 ENCOUNTER — Ambulatory Visit
Admission: RE | Admit: 2022-12-28 | Discharge: 2022-12-28 | Disposition: A | Discharge status: Home / Self Care | Payer: Medicare HMO | Source: Ambulatory Visit | Attending: Radiation Oncology | Admitting: Radiation Oncology

## 2022-12-28 ENCOUNTER — Other Ambulatory Visit: Payer: Self-pay | Admitting: Internal Medicine

## 2022-12-28 ENCOUNTER — Other Ambulatory Visit: Payer: Self-pay

## 2022-12-28 ENCOUNTER — Ambulatory Visit: Payer: Medicare HMO

## 2022-12-28 DIAGNOSIS — Z510 Encounter for antineoplastic radiation therapy: Secondary | ICD-10-CM | POA: Diagnosis not present

## 2022-12-28 DIAGNOSIS — C61 Malignant neoplasm of prostate: Secondary | ICD-10-CM | POA: Diagnosis not present

## 2022-12-28 DIAGNOSIS — Z191 Hormone sensitive malignancy status: Secondary | ICD-10-CM | POA: Diagnosis not present

## 2022-12-28 LAB — RAD ONC ARIA SESSION SUMMARY
Course Elapsed Days: 11
Plan Fractions Treated to Date: 10
Plan Prescribed Dose Per Fraction: 2.5 Gy
Plan Total Fractions Prescribed: 28
Plan Total Prescribed Dose: 70 Gy
Reference Point Dosage Given to Date: 25 Gy
Reference Point Session Dosage Given: 2.5 Gy
Session Number: 10

## 2022-12-31 ENCOUNTER — Other Ambulatory Visit: Payer: Self-pay

## 2022-12-31 ENCOUNTER — Ambulatory Visit
Admission: RE | Admit: 2022-12-31 | Discharge: 2022-12-31 | Disposition: A | Discharge status: Home / Self Care | Payer: Medicare HMO | Source: Ambulatory Visit | Attending: Radiation Oncology | Admitting: Radiation Oncology

## 2022-12-31 DIAGNOSIS — Z510 Encounter for antineoplastic radiation therapy: Secondary | ICD-10-CM | POA: Diagnosis not present

## 2022-12-31 DIAGNOSIS — Z191 Hormone sensitive malignancy status: Secondary | ICD-10-CM | POA: Diagnosis not present

## 2022-12-31 DIAGNOSIS — C61 Malignant neoplasm of prostate: Secondary | ICD-10-CM | POA: Diagnosis not present

## 2022-12-31 LAB — RAD ONC ARIA SESSION SUMMARY
Course Elapsed Days: 14
Plan Fractions Treated to Date: 11
Plan Prescribed Dose Per Fraction: 2.5 Gy
Plan Total Fractions Prescribed: 28
Plan Total Prescribed Dose: 70 Gy
Reference Point Dosage Given to Date: 27.5 Gy
Reference Point Session Dosage Given: 2.5 Gy
Session Number: 11

## 2023-01-01 ENCOUNTER — Ambulatory Visit
Admission: RE | Admit: 2023-01-01 | Discharge: 2023-01-01 | Disposition: A | Discharge status: Home / Self Care | Payer: Medicare HMO | Source: Ambulatory Visit | Attending: Radiation Oncology | Admitting: Radiation Oncology

## 2023-01-01 ENCOUNTER — Other Ambulatory Visit: Payer: Self-pay

## 2023-01-01 DIAGNOSIS — C61 Malignant neoplasm of prostate: Secondary | ICD-10-CM | POA: Diagnosis not present

## 2023-01-01 DIAGNOSIS — Z191 Hormone sensitive malignancy status: Secondary | ICD-10-CM | POA: Diagnosis not present

## 2023-01-01 DIAGNOSIS — Z510 Encounter for antineoplastic radiation therapy: Secondary | ICD-10-CM | POA: Diagnosis not present

## 2023-01-01 LAB — RAD ONC ARIA SESSION SUMMARY
Course Elapsed Days: 15
Plan Fractions Treated to Date: 12
Plan Prescribed Dose Per Fraction: 2.5 Gy
Plan Total Fractions Prescribed: 28
Plan Total Prescribed Dose: 70 Gy
Reference Point Dosage Given to Date: 30 Gy
Reference Point Session Dosage Given: 2.5 Gy
Session Number: 12

## 2023-01-02 ENCOUNTER — Ambulatory Visit
Admission: RE | Admit: 2023-01-02 | Discharge: 2023-01-02 | Disposition: A | Discharge status: Home / Self Care | Payer: Medicare HMO | Source: Ambulatory Visit | Attending: Radiation Oncology | Admitting: Radiation Oncology

## 2023-01-02 ENCOUNTER — Other Ambulatory Visit: Payer: Self-pay

## 2023-01-02 DIAGNOSIS — Z510 Encounter for antineoplastic radiation therapy: Secondary | ICD-10-CM | POA: Diagnosis not present

## 2023-01-02 DIAGNOSIS — C61 Malignant neoplasm of prostate: Secondary | ICD-10-CM | POA: Diagnosis not present

## 2023-01-02 DIAGNOSIS — Z191 Hormone sensitive malignancy status: Secondary | ICD-10-CM | POA: Diagnosis not present

## 2023-01-02 LAB — RAD ONC ARIA SESSION SUMMARY
Course Elapsed Days: 16
Plan Fractions Treated to Date: 13
Plan Prescribed Dose Per Fraction: 2.5 Gy
Plan Total Fractions Prescribed: 28
Plan Total Prescribed Dose: 70 Gy
Reference Point Dosage Given to Date: 32.5 Gy
Reference Point Session Dosage Given: 2.5 Gy
Session Number: 13

## 2023-01-03 ENCOUNTER — Other Ambulatory Visit: Payer: Self-pay

## 2023-01-03 ENCOUNTER — Ambulatory Visit
Admission: RE | Admit: 2023-01-03 | Discharge: 2023-01-03 | Disposition: A | Discharge status: Home / Self Care | Payer: Medicare HMO | Source: Ambulatory Visit | Attending: Radiation Oncology | Admitting: Radiation Oncology

## 2023-01-03 DIAGNOSIS — Z191 Hormone sensitive malignancy status: Secondary | ICD-10-CM | POA: Diagnosis not present

## 2023-01-03 DIAGNOSIS — C61 Malignant neoplasm of prostate: Secondary | ICD-10-CM | POA: Diagnosis not present

## 2023-01-03 DIAGNOSIS — Z510 Encounter for antineoplastic radiation therapy: Secondary | ICD-10-CM | POA: Diagnosis not present

## 2023-01-03 LAB — RAD ONC ARIA SESSION SUMMARY
Course Elapsed Days: 17
Plan Fractions Treated to Date: 14
Plan Prescribed Dose Per Fraction: 2.5 Gy
Plan Total Fractions Prescribed: 28
Plan Total Prescribed Dose: 70 Gy
Reference Point Dosage Given to Date: 35 Gy
Reference Point Session Dosage Given: 2.5 Gy
Session Number: 14

## 2023-01-04 ENCOUNTER — Ambulatory Visit
Admission: RE | Admit: 2023-01-04 | Discharge: 2023-01-04 | Disposition: A | Discharge status: Home / Self Care | Payer: Medicare HMO | Source: Ambulatory Visit | Attending: Radiation Oncology | Admitting: Radiation Oncology

## 2023-01-04 ENCOUNTER — Other Ambulatory Visit: Payer: Self-pay

## 2023-01-04 DIAGNOSIS — Z510 Encounter for antineoplastic radiation therapy: Secondary | ICD-10-CM | POA: Diagnosis not present

## 2023-01-04 DIAGNOSIS — Z191 Hormone sensitive malignancy status: Secondary | ICD-10-CM | POA: Diagnosis not present

## 2023-01-04 DIAGNOSIS — C61 Malignant neoplasm of prostate: Secondary | ICD-10-CM | POA: Diagnosis not present

## 2023-01-04 LAB — RAD ONC ARIA SESSION SUMMARY
Course Elapsed Days: 18
Plan Fractions Treated to Date: 15
Plan Prescribed Dose Per Fraction: 2.5 Gy
Plan Total Fractions Prescribed: 28
Plan Total Prescribed Dose: 70 Gy
Reference Point Dosage Given to Date: 37.5 Gy
Reference Point Session Dosage Given: 2.5 Gy
Session Number: 15

## 2023-01-07 ENCOUNTER — Other Ambulatory Visit: Payer: Self-pay

## 2023-01-07 ENCOUNTER — Ambulatory Visit
Admission: RE | Admit: 2023-01-07 | Discharge: 2023-01-07 | Disposition: A | Discharge status: Home / Self Care | Payer: Medicare HMO | Source: Ambulatory Visit | Attending: Radiation Oncology | Admitting: Radiation Oncology

## 2023-01-07 DIAGNOSIS — C61 Malignant neoplasm of prostate: Secondary | ICD-10-CM | POA: Diagnosis not present

## 2023-01-07 DIAGNOSIS — Z510 Encounter for antineoplastic radiation therapy: Secondary | ICD-10-CM | POA: Diagnosis not present

## 2023-01-07 DIAGNOSIS — Z191 Hormone sensitive malignancy status: Secondary | ICD-10-CM | POA: Diagnosis not present

## 2023-01-07 LAB — RAD ONC ARIA SESSION SUMMARY
Course Elapsed Days: 21
Plan Fractions Treated to Date: 16
Plan Prescribed Dose Per Fraction: 2.5 Gy
Plan Total Fractions Prescribed: 28
Plan Total Prescribed Dose: 70 Gy
Reference Point Dosage Given to Date: 40 Gy
Reference Point Session Dosage Given: 2.5 Gy
Session Number: 16

## 2023-01-08 ENCOUNTER — Ambulatory Visit
Admission: RE | Admit: 2023-01-08 | Discharge: 2023-01-08 | Disposition: A | Discharge status: Home / Self Care | Payer: Medicare HMO | Source: Ambulatory Visit | Attending: Radiation Oncology | Admitting: Radiation Oncology

## 2023-01-08 ENCOUNTER — Other Ambulatory Visit: Payer: Self-pay

## 2023-01-08 DIAGNOSIS — C61 Malignant neoplasm of prostate: Secondary | ICD-10-CM | POA: Diagnosis not present

## 2023-01-08 DIAGNOSIS — Z510 Encounter for antineoplastic radiation therapy: Secondary | ICD-10-CM | POA: Diagnosis not present

## 2023-01-08 DIAGNOSIS — Z191 Hormone sensitive malignancy status: Secondary | ICD-10-CM | POA: Diagnosis not present

## 2023-01-08 LAB — RAD ONC ARIA SESSION SUMMARY
Course Elapsed Days: 22
Plan Fractions Treated to Date: 17
Plan Prescribed Dose Per Fraction: 2.5 Gy
Plan Total Fractions Prescribed: 28
Plan Total Prescribed Dose: 70 Gy
Reference Point Dosage Given to Date: 42.5 Gy
Reference Point Session Dosage Given: 2.5 Gy
Session Number: 17

## 2023-01-09 ENCOUNTER — Ambulatory Visit
Admission: RE | Admit: 2023-01-09 | Discharge: 2023-01-09 | Disposition: A | Discharge status: Home / Self Care | Payer: Medicare HMO | Source: Ambulatory Visit | Attending: Radiation Oncology | Admitting: Radiation Oncology

## 2023-01-09 ENCOUNTER — Other Ambulatory Visit: Payer: Self-pay

## 2023-01-09 DIAGNOSIS — Z191 Hormone sensitive malignancy status: Secondary | ICD-10-CM | POA: Diagnosis not present

## 2023-01-09 DIAGNOSIS — Z510 Encounter for antineoplastic radiation therapy: Secondary | ICD-10-CM | POA: Diagnosis not present

## 2023-01-09 DIAGNOSIS — C61 Malignant neoplasm of prostate: Secondary | ICD-10-CM | POA: Diagnosis not present

## 2023-01-09 LAB — RAD ONC ARIA SESSION SUMMARY
Course Elapsed Days: 23
Plan Fractions Treated to Date: 18
Plan Prescribed Dose Per Fraction: 2.5 Gy
Plan Total Fractions Prescribed: 28
Plan Total Prescribed Dose: 70 Gy
Reference Point Dosage Given to Date: 45 Gy
Reference Point Session Dosage Given: 2.5 Gy
Session Number: 18

## 2023-01-10 ENCOUNTER — Other Ambulatory Visit: Payer: Self-pay | Admitting: Radiation Oncology

## 2023-01-10 ENCOUNTER — Other Ambulatory Visit: Payer: Self-pay

## 2023-01-10 ENCOUNTER — Ambulatory Visit
Admission: RE | Admit: 2023-01-10 | Discharge: 2023-01-10 | Disposition: A | Discharge status: Home / Self Care | Payer: Medicare HMO | Source: Ambulatory Visit | Attending: Radiation Oncology | Admitting: Radiation Oncology

## 2023-01-10 ENCOUNTER — Ambulatory Visit: Payer: Medicare HMO

## 2023-01-10 DIAGNOSIS — Z510 Encounter for antineoplastic radiation therapy: Secondary | ICD-10-CM | POA: Diagnosis not present

## 2023-01-10 DIAGNOSIS — Z191 Hormone sensitive malignancy status: Secondary | ICD-10-CM | POA: Diagnosis not present

## 2023-01-10 DIAGNOSIS — C61 Malignant neoplasm of prostate: Secondary | ICD-10-CM | POA: Diagnosis not present

## 2023-01-10 LAB — RAD ONC ARIA SESSION SUMMARY
Course Elapsed Days: 24
Plan Fractions Treated to Date: 19
Plan Prescribed Dose Per Fraction: 2.5 Gy
Plan Total Fractions Prescribed: 28
Plan Total Prescribed Dose: 70 Gy
Reference Point Dosage Given to Date: 47.5 Gy
Reference Point Session Dosage Given: 2.5 Gy
Session Number: 19

## 2023-01-10 MED ORDER — TAMSULOSIN HCL 0.4 MG PO CAPS: 0.4000 mg | ORAL_CAPSULE | Freq: Two times a day (BID) | ORAL | 3 refills | Status: AC

## 2023-01-11 ENCOUNTER — Ambulatory Visit
Admission: RE | Admit: 2023-01-11 | Discharge: 2023-01-11 | Disposition: A | Discharge status: Home / Self Care | Payer: Medicare HMO | Source: Ambulatory Visit | Attending: Radiation Oncology | Admitting: Radiation Oncology

## 2023-01-11 ENCOUNTER — Other Ambulatory Visit: Payer: Self-pay

## 2023-01-11 DIAGNOSIS — Z191 Hormone sensitive malignancy status: Secondary | ICD-10-CM | POA: Diagnosis not present

## 2023-01-11 DIAGNOSIS — C61 Malignant neoplasm of prostate: Secondary | ICD-10-CM | POA: Diagnosis not present

## 2023-01-11 DIAGNOSIS — Z510 Encounter for antineoplastic radiation therapy: Secondary | ICD-10-CM | POA: Diagnosis not present

## 2023-01-11 LAB — RAD ONC ARIA SESSION SUMMARY
Course Elapsed Days: 25
Plan Fractions Treated to Date: 20
Plan Prescribed Dose Per Fraction: 2.5 Gy
Plan Total Fractions Prescribed: 28
Plan Total Prescribed Dose: 70 Gy
Reference Point Dosage Given to Date: 50 Gy
Reference Point Session Dosage Given: 2.5 Gy
Session Number: 20

## 2023-01-14 ENCOUNTER — Ambulatory Visit
Admission: RE | Admit: 2023-01-14 | Discharge: 2023-01-14 | Disposition: A | Discharge status: Home / Self Care | Payer: Medicare HMO | Source: Ambulatory Visit | Attending: Radiation Oncology | Admitting: Radiation Oncology

## 2023-01-14 ENCOUNTER — Other Ambulatory Visit: Payer: Self-pay

## 2023-01-14 DIAGNOSIS — Z510 Encounter for antineoplastic radiation therapy: Secondary | ICD-10-CM | POA: Diagnosis not present

## 2023-01-14 DIAGNOSIS — C61 Malignant neoplasm of prostate: Secondary | ICD-10-CM | POA: Diagnosis not present

## 2023-01-14 DIAGNOSIS — Z191 Hormone sensitive malignancy status: Secondary | ICD-10-CM | POA: Diagnosis not present

## 2023-01-14 LAB — RAD ONC ARIA SESSION SUMMARY
Course Elapsed Days: 28
Plan Fractions Treated to Date: 21
Plan Prescribed Dose Per Fraction: 2.5 Gy
Plan Total Fractions Prescribed: 28
Plan Total Prescribed Dose: 70 Gy
Reference Point Dosage Given to Date: 52.5 Gy
Reference Point Session Dosage Given: 2.5 Gy
Session Number: 21

## 2023-01-15 ENCOUNTER — Other Ambulatory Visit: Payer: Self-pay

## 2023-01-15 ENCOUNTER — Ambulatory Visit
Admission: RE | Admit: 2023-01-15 | Discharge: 2023-01-15 | Disposition: A | Discharge status: Home / Self Care | Payer: Medicare HMO | Source: Ambulatory Visit | Attending: Radiation Oncology | Admitting: Radiation Oncology

## 2023-01-15 DIAGNOSIS — Z510 Encounter for antineoplastic radiation therapy: Secondary | ICD-10-CM | POA: Diagnosis not present

## 2023-01-15 DIAGNOSIS — Z191 Hormone sensitive malignancy status: Secondary | ICD-10-CM | POA: Diagnosis not present

## 2023-01-15 DIAGNOSIS — C61 Malignant neoplasm of prostate: Secondary | ICD-10-CM | POA: Diagnosis not present

## 2023-01-15 LAB — RAD ONC ARIA SESSION SUMMARY
Course Elapsed Days: 29
Plan Fractions Treated to Date: 22
Plan Prescribed Dose Per Fraction: 2.5 Gy
Plan Total Fractions Prescribed: 28
Plan Total Prescribed Dose: 70 Gy
Reference Point Dosage Given to Date: 55 Gy
Reference Point Session Dosage Given: 2.5 Gy
Session Number: 22

## 2023-01-16 ENCOUNTER — Other Ambulatory Visit: Payer: Self-pay

## 2023-01-16 ENCOUNTER — Ambulatory Visit
Admission: RE | Admit: 2023-01-16 | Discharge: 2023-01-16 | Disposition: A | Discharge status: Home / Self Care | Payer: Medicare HMO | Source: Ambulatory Visit | Attending: Radiation Oncology | Admitting: Radiation Oncology

## 2023-01-16 DIAGNOSIS — C61 Malignant neoplasm of prostate: Secondary | ICD-10-CM | POA: Insufficient documentation

## 2023-01-16 DIAGNOSIS — Z191 Hormone sensitive malignancy status: Secondary | ICD-10-CM | POA: Diagnosis not present

## 2023-01-16 DIAGNOSIS — Z510 Encounter for antineoplastic radiation therapy: Secondary | ICD-10-CM | POA: Diagnosis not present

## 2023-01-16 LAB — RAD ONC ARIA SESSION SUMMARY
Course Elapsed Days: 30
Plan Fractions Treated to Date: 23
Plan Prescribed Dose Per Fraction: 2.5 Gy
Plan Total Fractions Prescribed: 28
Plan Total Prescribed Dose: 70 Gy
Reference Point Dosage Given to Date: 57.5 Gy
Reference Point Session Dosage Given: 2.5 Gy
Session Number: 23

## 2023-01-17 ENCOUNTER — Ambulatory Visit
Admission: RE | Admit: 2023-01-17 | Discharge: 2023-01-17 | Disposition: A | Discharge status: Home / Self Care | Payer: Medicare HMO | Source: Ambulatory Visit | Attending: Radiation Oncology | Admitting: Radiation Oncology

## 2023-01-17 ENCOUNTER — Other Ambulatory Visit: Payer: Self-pay

## 2023-01-17 DIAGNOSIS — Z510 Encounter for antineoplastic radiation therapy: Secondary | ICD-10-CM | POA: Diagnosis not present

## 2023-01-17 DIAGNOSIS — C61 Malignant neoplasm of prostate: Secondary | ICD-10-CM | POA: Diagnosis not present

## 2023-01-17 DIAGNOSIS — Z191 Hormone sensitive malignancy status: Secondary | ICD-10-CM | POA: Diagnosis not present

## 2023-01-17 LAB — RAD ONC ARIA SESSION SUMMARY
Course Elapsed Days: 31
Plan Fractions Treated to Date: 24
Plan Prescribed Dose Per Fraction: 2.5 Gy
Plan Total Fractions Prescribed: 28
Plan Total Prescribed Dose: 70 Gy
Reference Point Dosage Given to Date: 60 Gy
Reference Point Session Dosage Given: 2.5 Gy
Session Number: 24

## 2023-01-18 ENCOUNTER — Ambulatory Visit
Admission: RE | Admit: 2023-01-18 | Discharge: 2023-01-18 | Disposition: A | Discharge status: Home / Self Care | Payer: Medicare HMO | Source: Ambulatory Visit | Attending: Radiation Oncology | Admitting: Radiation Oncology

## 2023-01-18 ENCOUNTER — Other Ambulatory Visit: Payer: Self-pay

## 2023-01-18 DIAGNOSIS — Z510 Encounter for antineoplastic radiation therapy: Secondary | ICD-10-CM | POA: Diagnosis not present

## 2023-01-18 DIAGNOSIS — Z191 Hormone sensitive malignancy status: Secondary | ICD-10-CM | POA: Diagnosis not present

## 2023-01-18 DIAGNOSIS — C61 Malignant neoplasm of prostate: Secondary | ICD-10-CM | POA: Diagnosis not present

## 2023-01-18 LAB — RAD ONC ARIA SESSION SUMMARY
Course Elapsed Days: 32
Plan Fractions Treated to Date: 25
Plan Prescribed Dose Per Fraction: 2.5 Gy
Plan Total Fractions Prescribed: 28
Plan Total Prescribed Dose: 70 Gy
Reference Point Dosage Given to Date: 62.5 Gy
Reference Point Session Dosage Given: 2.5 Gy
Session Number: 25

## 2023-01-21 ENCOUNTER — Ambulatory Visit
Admission: RE | Admit: 2023-01-21 | Discharge: 2023-01-21 | Disposition: A | Discharge status: Home / Self Care | Payer: Medicare HMO | Source: Ambulatory Visit | Attending: Radiation Oncology | Admitting: Radiation Oncology

## 2023-01-21 ENCOUNTER — Other Ambulatory Visit: Payer: Self-pay

## 2023-01-21 DIAGNOSIS — C61 Malignant neoplasm of prostate: Secondary | ICD-10-CM | POA: Diagnosis not present

## 2023-01-21 DIAGNOSIS — Z510 Encounter for antineoplastic radiation therapy: Secondary | ICD-10-CM | POA: Diagnosis not present

## 2023-01-21 DIAGNOSIS — Z191 Hormone sensitive malignancy status: Secondary | ICD-10-CM | POA: Diagnosis not present

## 2023-01-21 LAB — RAD ONC ARIA SESSION SUMMARY
Course Elapsed Days: 35
Plan Fractions Treated to Date: 26
Plan Prescribed Dose Per Fraction: 2.5 Gy
Plan Total Fractions Prescribed: 28
Plan Total Prescribed Dose: 70 Gy
Reference Point Dosage Given to Date: 65 Gy
Reference Point Session Dosage Given: 2.5 Gy
Session Number: 26

## 2023-01-22 ENCOUNTER — Other Ambulatory Visit: Payer: Self-pay

## 2023-01-22 ENCOUNTER — Ambulatory Visit
Admission: RE | Admit: 2023-01-22 | Discharge: 2023-01-22 | Disposition: A | Discharge status: Home / Self Care | Payer: Medicare HMO | Source: Ambulatory Visit | Attending: Radiation Oncology | Admitting: Radiation Oncology

## 2023-01-22 DIAGNOSIS — Z510 Encounter for antineoplastic radiation therapy: Secondary | ICD-10-CM | POA: Diagnosis not present

## 2023-01-22 DIAGNOSIS — C61 Malignant neoplasm of prostate: Secondary | ICD-10-CM | POA: Diagnosis not present

## 2023-01-22 DIAGNOSIS — Z191 Hormone sensitive malignancy status: Secondary | ICD-10-CM | POA: Diagnosis not present

## 2023-01-22 LAB — RAD ONC ARIA SESSION SUMMARY
Course Elapsed Days: 36
Plan Fractions Treated to Date: 27
Plan Prescribed Dose Per Fraction: 2.5 Gy
Plan Total Fractions Prescribed: 28
Plan Total Prescribed Dose: 70 Gy
Reference Point Dosage Given to Date: 67.5 Gy
Reference Point Session Dosage Given: 2.5 Gy
Session Number: 27

## 2023-01-23 ENCOUNTER — Other Ambulatory Visit: Payer: Self-pay

## 2023-01-23 ENCOUNTER — Ambulatory Visit: Payer: Medicare HMO

## 2023-01-23 ENCOUNTER — Ambulatory Visit
Admission: RE | Admit: 2023-01-23 | Discharge: 2023-01-23 | Disposition: A | Discharge status: Home / Self Care | Payer: Medicare HMO | Source: Ambulatory Visit | Attending: Radiation Oncology | Admitting: Radiation Oncology

## 2023-01-23 DIAGNOSIS — Z191 Hormone sensitive malignancy status: Secondary | ICD-10-CM | POA: Diagnosis not present

## 2023-01-23 DIAGNOSIS — Z510 Encounter for antineoplastic radiation therapy: Secondary | ICD-10-CM | POA: Diagnosis not present

## 2023-01-23 DIAGNOSIS — C61 Malignant neoplasm of prostate: Secondary | ICD-10-CM | POA: Diagnosis not present

## 2023-01-23 LAB — RAD ONC ARIA SESSION SUMMARY
Course Elapsed Days: 37
Plan Fractions Treated to Date: 28
Plan Prescribed Dose Per Fraction: 2.5 Gy
Plan Total Fractions Prescribed: 28
Plan Total Prescribed Dose: 70 Gy
Reference Point Dosage Given to Date: 70 Gy
Reference Point Session Dosage Given: 2.5 Gy
Session Number: 28

## 2023-01-25 NOTE — Progress Notes (Signed)
Patient was a RadOnc Consult on 2/19 for his Stage T1c adenocarcinoma of the prostate with Gleason score of 3+4, and PSA of 4.84.  Patient proceed with treatment recommendations of 5.5 weeks of external radiation and had his final radiation treatment on 5/8.   Patient is scheduled for a post treatment nurse call on 6/25 and RN scheduled his first post treatment PSA on 8/9 @ 9:45am followed by MD follow up on 8/16 @ 2:15pm at [facility].    RN left message for call back to review appointment information @ [facility].

## 2023-01-28 NOTE — Radiation Completion Notes (Addendum)
  Radiation Oncology         [phone] ________________________________  Name: Cully Metrick MRN: [id number]  Date: 01/23/2023  DOB: 1951/02/25  End of Treatment Note  Patient Name: Keith [name], Keith [name] MRN: [id number] Date of Birth: 12-Nov-1950 Referring Physician: Jerilee Field, M.D. Date of Service: 2023-01-28 Radiation Oncologist: Margaretmary Bayley, M.D. [facility]     RADIATION ONCOLOGY END OF TREATMENT NOTE     Diagnosis: 72 y.o. gentleman with Stage T1c adenocarcinoma of the prostate with Gleason score of 3+4, and PSA of 4.84.   Intent: Curative     ==========DELIVERED PLANS==========  First Treatment Date: 2022-12-17 - Last Treatment Date: 2023-01-23   Plan Name: Prostate Site: Prostate Technique: IMRT Mode: Photon Dose Per Fraction: 2.5 Gy Prescribed Dose (Delivered / Prescribed): 70 Gy / 70 Gy Prescribed Fxs (Delivered / Prescribed): 28 / 28     ==========ON TREATMENT VISIT DATES========== 2022-12-21, 2022-12-28, 2023-01-04, 2023-01-10, 2023-01-18, 2023-01-22    See weekly On Treatment Notes is Epic for details. The patient tolerated radiation treatment relatively well with only minor urinary irritation and modest fatigue.  He did report increased urgency, nocturia and dysuria at the start of his stream as well as mild diarrhea.  The patient will receive a call in about one month from the radiation oncology department. He will continue follow up with his urologist, Dr. Mena Goes as well.  ------------------------------------------------   Margaretmary Dys, MD [facility]  Radiation Oncology Direct Dial: [phone]  Fax: [phone] [email or web address]  Skype  LinkedIn

## 2023-02-01 NOTE — Progress Notes (Signed)
RN spoke with patient and provided appointment dates for follow up at [facility].  RN educated on importance on post treatment follow up's, patient verbalized understanding.  No additional needs at this time.

## 2023-03-08 ENCOUNTER — Other Ambulatory Visit: Payer: Self-pay | Admitting: Urology

## 2023-03-08 DIAGNOSIS — C61 Malignant neoplasm of prostate: Secondary | ICD-10-CM

## 2023-03-11 ENCOUNTER — Other Ambulatory Visit: Payer: Self-pay | Admitting: Internal Medicine

## 2023-03-12 ENCOUNTER — Ambulatory Visit
Admission: RE | Admit: 2023-03-12 | Discharge: 2023-03-12 | Disposition: A | Discharge status: Home / Self Care | Payer: Medicare HMO | Source: Ambulatory Visit | Attending: Radiation Oncology | Admitting: Radiation Oncology

## 2023-03-12 NOTE — Progress Notes (Signed)
  Radiation Oncology         [phone] ________________________________  Name: Keith [name] MRN: [id number]  Date of Service: 03/12/2023  DOB: [birth year]  Post Treatment Telephone Note  Diagnosis:  Stage T1c adenocarcinoma of the prostate with Gleason score of 3+4, and PSA of 4.84. (as documented in provider EOT note)   Pre Treatment IPSS Score: 12 (as documented in the provider consult note)   The patient was available for call today.   Symptoms of fatigue have improved since completing therapy.  Symptoms of bladder changes have improved since completing therapy. Current symptoms include polyuria, and medications for bladder symptoms include Tamsulosin.  Symptoms of bowel changes have improved since completing therapy. Current symptoms include none, and medications for bowel symptoms include none.     Post Treatment IPSS Score: IPSS Questionnaire (AUA-7): Over the past month.   1)  How often have you had a sensation of not emptying your bladder completely after you finish urinating?  2 - Less than half the time  2)  How often have you had to urinate again less than two hours after you finished urinating? 5 - Almost always  3)  How often have you found you stopped and started again several times when you urinated?  1 - Less than 1 time in 5  4) How difficult have you found it to postpone urination?  1 - Less than 1 time in 5  5) How often have you had a weak urinary stream?  2 - Less than half the time  6) How often have you had to push or strain to begin urination?  0 - Not at all  7) How many times did you most typically get up to urinate from the time you went to bed until the time you got up in the morning?  2 - 2 times  Total score:  13. Which indicates moderate symptoms  0-7 mildly symptomatic   8-19 moderately symptomatic   20-35 severely symptomatic    Patient (has a scheduled follow up visit with his urologist, Dr. Mena Goes , on 04/2023 for ongoing  surveillance. He was counseled that PSA levels will be drawn in the urology office, and was reassured that additional time is expected to improve bowel and bladder symptoms. He was encouraged to call back with concerns or questions regarding radiation.   This concludes the interaction.  Ruel Favors, LPN

## 2023-03-27 ENCOUNTER — Other Ambulatory Visit: Payer: Self-pay | Admitting: Internal Medicine

## 2023-03-27 DIAGNOSIS — E785 Hyperlipidemia, unspecified: Secondary | ICD-10-CM

## 2023-04-02 ENCOUNTER — Encounter: Payer: Self-pay | Admitting: *Deleted

## 2023-04-03 ENCOUNTER — Telehealth: Payer: Self-pay | Admitting: Pulmonary Disease

## 2023-04-04 NOTE — Telephone Encounter (Signed)
Pt. Received samples of Fluticasone-Umeclidin-Vilant (TRELEGY ELLIPTA) 100-62.5-25 MCG/ACT AEPB   is working well for pt. But needs a script called into pharmacy has up coming apt. To see DR. dewald

## 2023-04-10 MED ORDER — TRELEGY ELLIPTA 100-62.5-25 MCG/ACT IN AEPB: 60.0000 | INHALATION_SPRAY | Freq: Every day | RESPIRATORY_TRACT | 5 refills | Status: DC

## 2023-04-10 NOTE — Telephone Encounter (Signed)
Called and spoke with patient.  Patient requested a Trelegy prescription sent to [facility].  Requested prescription sent to requested pharmacy.  Nothing further at this time.

## 2023-04-15 ENCOUNTER — Inpatient Hospital Stay: Payer: Medicare HMO | Attending: Adult Health | Admitting: *Deleted

## 2023-04-15 ENCOUNTER — Encounter: Payer: Self-pay | Admitting: *Deleted

## 2023-04-15 DIAGNOSIS — C61 Malignant neoplasm of prostate: Secondary | ICD-10-CM

## 2023-04-15 NOTE — Progress Notes (Signed)
Pt will have post PSA labs drawn on Aug 9th at [facility], then see Dr. Mena Goes on Aug.16th. SCP reviewed and completed.

## 2023-04-25 DIAGNOSIS — N401 Enlarged prostate with lower urinary tract symptoms: Secondary | ICD-10-CM | POA: Diagnosis not present

## 2023-04-26 DIAGNOSIS — N401 Enlarged prostate with lower urinary tract symptoms: Secondary | ICD-10-CM | POA: Diagnosis not present

## 2023-04-26 LAB — PSA: PSA: 0.9

## 2023-05-03 DIAGNOSIS — C61 Malignant neoplasm of prostate: Secondary | ICD-10-CM | POA: Diagnosis not present

## 2023-05-03 DIAGNOSIS — N401 Enlarged prostate with lower urinary tract symptoms: Secondary | ICD-10-CM | POA: Diagnosis not present

## 2023-05-03 DIAGNOSIS — R3911 Hesitancy of micturition: Secondary | ICD-10-CM | POA: Diagnosis not present

## 2023-06-11 ENCOUNTER — Ambulatory Visit (INDEPENDENT_AMBULATORY_CARE_PROVIDER_SITE_OTHER): Payer: Medicare HMO | Admitting: Pulmonary Disease

## 2023-06-11 ENCOUNTER — Encounter: Payer: Self-pay | Admitting: Pulmonary Disease

## 2023-06-11 VITALS — BP 104/60 | HR 85 | Temp 98.4°F | Ht 65.0 in | Wt 125.8 lb

## 2023-06-11 DIAGNOSIS — J432 Centrilobular emphysema: Secondary | ICD-10-CM | POA: Diagnosis not present

## 2023-06-11 MED ORDER — ALBUTEROL SULFATE HFA 108 (90 BASE) MCG/ACT IN AERS
2.0000 | INHALATION_SPRAY | Freq: Four times a day (QID) | RESPIRATORY_TRACT | 6 refills | Status: AC | PRN
Start: 2023-06-11 — End: ?

## 2023-06-11 NOTE — Progress Notes (Signed)
Synopsis: Referred in August 2023 for mucopurulent bronchitis by Sanda Linger, MD  Subjective:   PATIENT ID: Keith [name] GENDER: male DOB: 03-17-1951, MRN: [id number]  HPI  Chief Complaint  Patient presents with   Follow-up    Breathing is doing well. He is not coughing much. He is still smoking occ.    Keith [name] is a 72 year old male, daily smoker with hypertension who returns to pulmonary clinic for COPD.   He is smoking a cigarette per day. His breathing has overall been doing ok. He does not have much of a cough.   OV 10/15/22 He was seen by Dr. Marchelle Gearing in follow up on 07/10/22 where PFTs showed mild diffusion defect. He was unsure if he noted benefit to [facility]. He was started on spiriva 2.54mcg 2 puffs daily. He feels it helped his breathing somewhat. He has intermittent dry cough.   He is smoking intermittently now. A pack may last him 2 weeks.   Initial OV 04/24/22 He has centrilobular emphysema noted on lung cancer screening CT Chest 09/2021 along with a 7.45mm LUL pulmonary nodule that is stable from 05/2021.   He reports cough over the past 2 years that is occasionally productive of yellowish white sputum. He denies wheezing. He has exertional dyspnea with strenuous activity otherwise denies dyspnea when walking through a store. He is active doing yardwork at his home.   He has been smoking for 40 years. He is currently smoking 2-3 cigarettes per day. He is a retired Scientist, water quality. He is currently undergoing lung cancer screening by his primary care team. He lives with his wife.    Past Medical History:  Diagnosis Date   Cancer (HCC)    Chicken pox    COPD (chronic obstructive pulmonary disease) (HCC)    patient denies   Hyperlipidemia    on meds   Hypertension      Family History  Problem Relation Age of Onset   Hypertension Mother    Heart attack Mother        No details   Hypertension Father    Heart attack Father        No details   Heart  attack Sister        No details   Pancreatic cancer Brother    Alcohol abuse Brother    Alcohol abuse Brother    Hypertension Brother    Lung cancer Brother    Colon polyps Neg Hx    Colon cancer Neg Hx    Esophageal cancer Neg Hx    Stomach cancer Neg Hx    Rectal cancer Neg Hx      Social History   Socioeconomic History   Marital status: Married    Spouse name: Not on file   Number of children: 2   Years of education: Not on file   Highest education level: Not on file  Occupational History   Not on file  Tobacco Use   Smoking status: Some Days    Current packs/day: 0.00    Average packs/day: 0.5 packs/day for 53.4 years (26.7 ttl pk-yrs)    Types: Cigarettes    Start date: 02/15/1969    Last attempt to quit: 07/04/2022    Years since quitting: 0.9   Smokeless tobacco: Never   Tobacco comments:    Smokes about 3-4 cigarettes per day  Vaping Use   Vaping status: Never Used  Substance and Sexual Activity   Alcohol use: Not Currently  Alcohol/week: 2.0 standard drinks of alcohol    Types: 2 Standard drinks or equivalent per week    Comment: Patient quit drinking 03/2021   Drug use: Never   Sexual activity: Not Currently    Partners: Female  Other Topics Concern   Not on file  Social History Narrative   Lives with wife, retired.  2 children and one grand.     Lives in a one story    Right Handed, but patient can use left    Social Determinants of Health   Financial Resource Strain: Low Risk  (09/20/2022)   Overall Financial Resource Strain (CARDIA)    Difficulty of Paying Living Expenses: Not hard at all  Food Insecurity: No Food Insecurity (09/20/2022)   Hunger Vital Sign    Worried About Running Out of Food in the Last Year: Never true    Ran Out of Food in the Last Year: Never true  Transportation Needs: No Transportation Needs (09/20/2022)   PRAPARE - Administrator, Civil Service (Medical): No    Lack of Transportation (Non-Medical): No  Physical  Activity: Not on file  Stress: No Stress Concern Present (09/20/2022)   [name]    Feeling of Stress : Not at all  Social Connections: Socially Integrated (09/20/2022)   Social Connection and Isolation Panel [NHANES]    Frequency of Communication with Friends and Family: More than three times a week    Frequency of Social Gatherings with Friends and Family: More than three times a week    Attends Religious Services: More than 4 times per year    Active Member of [employer] or Organizations: Yes    Attends Engineer, structural: More than 4 times per year    Marital Status: Married  Catering manager Violence: Not At Risk (09/20/2022)   Humiliation, Afraid, Rape, and Kick questionnaire    Fear of Current or Ex-Partner: No    Emotionally Abused: No    Physically Abused: No    Sexually Abused: No     No Known Allergies   Outpatient Medications Prior to Visit  Medication Sig Dispense Refill   Fluticasone-Umeclidin-Vilant (TRELEGY ELLIPTA) 100-62.5-25 MCG/ACT AEPB Inhale 60 each into the lungs daily. 60 each 5   ketoconazole (NIZORAL) 2 % shampoo Apply topically 3 (three) times a week.     magnesium oxide (MAG-OX) 400 (240 Mg) MG tablet TAKE 1 TABLET EVERY DAY 90 tablet 0   Multiple Vitamins-Minerals (SENTRY ADULT PO) Sentry     mupirocin ointment (BACTROBAN) 2 % Apply topically 2 (two) times daily.     rosuvastatin (CRESTOR) 5 MG tablet TAKE 1 TABLET EVERY DAY 90 tablet 1   tadalafil (CIALIS) 5 MG tablet Take 5 mg by mouth as needed.     tamsulosin (FLOMAX) 0.4 MG CAPS capsule Take 1 capsule (0.4 mg total) by mouth 2 (two) times daily. 180 capsule 3   tretinoin (RETIN-A) 0.1 % cream 1 application a pearl-sized amount to face in the evening Externally Once a day for 30 days     triamcinolone cream (KENALOG) 0.1 % Apply topically 2 (two) times daily.     VITAMIN D3 1.25 MG (50000 UT) capsule TAKE 1 CAPSULE EVERY WEEK 4  capsule 11   zinc gluconate 50 MG tablet Take 1 tablet (50 mg total) by mouth daily. 90 tablet 1   No facility-administered medications prior to visit.   Review of Systems  Constitutional:  Negative  for chills, fever, malaise/fatigue and weight loss.  HENT:  Negative for congestion, sinus pain and sore throat.   Eyes: Negative.   Respiratory:  Positive for shortness of breath. Negative for cough, hemoptysis, sputum production and wheezing.   Cardiovascular:  Negative for chest pain, palpitations, orthopnea, claudication and leg swelling.  Gastrointestinal:  Negative for abdominal pain, heartburn, nausea and vomiting.  Genitourinary: Negative.   Musculoskeletal:  Negative for joint pain and myalgias.  Skin:  Negative for rash.  Neurological:  Negative for weakness.  Endo/Heme/Allergies: Negative.   Psychiatric/Behavioral: Negative.     Objective:   Vitals:   06/11/23 0846  BP: 104/60  Pulse: 85  Temp: 98.4 F (36.9 C)  TempSrc: Oral  SpO2: 100%  Weight: 125 lb 12.8 oz (57.1 kg)  Height: 5\' 5"  (1.651 m)   Physical Exam Constitutional:      General: He is not in acute distress. HENT:     Head: Normocephalic and atraumatic.  Eyes:     Conjunctiva/sclera: Conjunctivae normal.  Cardiovascular:     Rate and Rhythm: Normal rate and regular rhythm.     Pulses: Normal pulses.     Heart sounds: Normal heart sounds. No murmur heard. Pulmonary:     Effort: Pulmonary effort is normal.     Breath sounds: Normal breath sounds.  Musculoskeletal:     Right [name] leg: No edema.     Left [name] leg: No edema.  Skin:    General: Skin is warm and dry.  Neurological:     General: No focal deficit present.     Mental Status: He is alert.     CBC    Component Value Date/Time   WBC 3.4 (L) 03/22/2022 0937   RBC 3.99 (L) 03/22/2022 0937   HGB 11.3 (L) 03/22/2022 0937   HCT 34.4 (L) 03/22/2022 0937   PLT 270.0 03/22/2022 0937   MCV 86.4 03/22/2022 0937   MCHC 32.9 03/22/2022 0937    RDW 13.9 03/22/2022 0937   LYMPHSABS 1.6 03/22/2022 0937   MONOABS 0.3 03/22/2022 0937   EOSABS 0.2 03/22/2022 0937   BASOSABS 0.0 03/22/2022 0937      Latest Ref Rng & Units 03/22/2022    9:37 AM 06/07/2021    3:55 PM 04/06/2021    2:42 PM  BMP  Glucose 70 - 99 mg/dL 829  87  79   BUN 6 - 23 mg/dL 16  17  12    Creatinine 0.40 - 1.50 mg/dL 5.62  1.30  8.65   Sodium 135 - 145 mEq/L 140  139  141   Potassium 3.5 - 5.1 mEq/L 4.1  4.1  4.4   Chloride 96 - 112 mEq/L 104  102  103   CO2 19 - 32 mEq/L 31  31  29    Calcium 8.4 - 10.5 mg/dL 9.5  9.6  9.6    Chest imaging: LCS CT Chest 10/10/21 1. Lung-RADS 2, benign appearance or behavior. Continue annual screening with low-dose chest CT without contrast in 12 months. 2. RCA coronary artery calcification. 3. Nonobstructing left renal calculus. 4. Aortic Atherosclerosis (ICD10-I70.0) and Emphysema  LCS CT Chest 10/10/21 - Mild diffuse bronchial wall thickening with mild centrilobular and paraseptal emphysema; imaging findings suggestive of underlying COPD. - Lung RADS 2S - stable 7.50mm RUL nodule from 06/02/21  PFT:    Latest Ref Rng & Units 06/28/2022    3:53 PM  PFT Results  FVC-Pre L 2.81   FVC-Predicted Pre % 80  FVC-Post L 2.86   FVC-Predicted Post % 81   Pre FEV1/FVC % % 73   Post FEV1/FCV % % 76   FEV1-Pre L 2.06   FEV1-Predicted Pre % 80   FEV1-Post L 2.18   DLCO uncorrected ml/min/mmHg 14.07   DLCO UNC% % 65   DLCO corrected ml/min/mmHg 14.07   DLCO COR %Predicted % 65   DLVA Predicted % 71   TLC L 5.10   TLC % Predicted % 86   RV % Predicted % 109     Labs:  Path:  Echo:  Heart Catheterization:  Assessment & Plan:   Centrilobular emphysema (HCC) - Plan: albuterol (VENTOLIN HFA) 108 (90 Base) MCG/ACT inhaler  Discussion: Keith [name] is a 72 year old male, daily smoker with hypertension who is referred to pulmonary clinic for COPD.   He is to continue relegy ellipta 1 puff daily and as  needed albuterol.   Follow up in 6 months.  Melody Comas, MD [facility] Office: [phone]   Current Outpatient Medications:    albuterol (VENTOLIN HFA) 108 (90 Base) MCG/ACT inhaler, Inhale 2 puffs into the lungs every 6 (six) hours as needed for wheezing or shortness of breath., Disp: 8 g, Rfl: 6   Fluticasone-Umeclidin-Vilant (TRELEGY ELLIPTA) 100-62.5-25 MCG/ACT AEPB, Inhale 60 each into the lungs daily., Disp: 60 each, Rfl: 5   ketoconazole (NIZORAL) 2 % shampoo, Apply topically 3 (three) times a week., Disp: , Rfl:    magnesium oxide (MAG-OX) 400 (240 Mg) MG tablet, TAKE 1 TABLET EVERY DAY, Disp: 90 tablet, Rfl: 0   Multiple Vitamins-Minerals (SENTRY ADULT PO), Sentry, Disp: , Rfl:    mupirocin ointment (BACTROBAN) 2 %, Apply topically 2 (two) times daily., Disp: , Rfl:    rosuvastatin (CRESTOR) 5 MG tablet, TAKE 1 TABLET EVERY DAY, Disp: 90 tablet, Rfl: 1   tadalafil (CIALIS) 5 MG tablet, Take 5 mg by mouth as needed., Disp: , Rfl:    tamsulosin (FLOMAX) 0.4 MG CAPS capsule, Take 1 capsule (0.4 mg total) by mouth 2 (two) times daily., Disp: 180 capsule, Rfl: 3   tretinoin (RETIN-A) 0.1 % cream, 1 application a pearl-sized amount to face in the evening Externally Once a day for 30 days, Disp: , Rfl:    triamcinolone cream (KENALOG) 0.1 %, Apply topically 2 (two) times daily., Disp: , Rfl:    VITAMIN D3 1.25 MG (50000 UT) capsule, TAKE 1 CAPSULE EVERY WEEK, Disp: 4 capsule, Rfl: 11   zinc gluconate 50 MG tablet, Take 1 tablet (50 mg total) by mouth daily., Disp: 90 tablet, Rfl: 1

## 2023-06-11 NOTE — Patient Instructions (Addendum)
Continue trelegy 1 puff daily - rinse mouth out after each use  Use albuterol inhaler 1-2 puffs every 4-6 hours as needed for cough, wheezing, shortness of breath or chest tightness  Recommend using mini nicotine lozenges 2mg  as needed for cigarette cravings. You can pick these up behind the main [facility] counter  Follow up in 6 months, call sooner if needed

## 2023-06-19 ENCOUNTER — Encounter: Payer: Self-pay | Admitting: Pulmonary Disease

## 2023-06-24 ENCOUNTER — Other Ambulatory Visit: Payer: Self-pay | Admitting: Internal Medicine

## 2023-06-24 DIAGNOSIS — E559 Vitamin D deficiency, unspecified: Secondary | ICD-10-CM

## 2023-07-29 ENCOUNTER — Telehealth: Payer: Self-pay | Admitting: Internal Medicine

## 2023-07-29 NOTE — Telephone Encounter (Signed)
Patient requested a refill of magnesium oxide (MAG-OX) 400 (240 Mg) MG tablet. He is due for an appointment because it has been over a year. He has been scheduled for Dr. Yetta Barre first opening on 08/28/2023, but would like to know if there is any way her can get a refill to last until that appointment. Please advise.

## 2023-08-28 ENCOUNTER — Encounter: Payer: Self-pay | Admitting: Internal Medicine

## 2023-08-28 ENCOUNTER — Ambulatory Visit (INDEPENDENT_AMBULATORY_CARE_PROVIDER_SITE_OTHER): Payer: Medicare HMO | Admitting: Internal Medicine

## 2023-08-28 VITALS — BP 110/58 | HR 54 | Temp 98.3°F | Resp 16 | Ht 65.0 in | Wt 127.6 lb

## 2023-08-28 DIAGNOSIS — R001 Bradycardia, unspecified: Secondary | ICD-10-CM | POA: Diagnosis not present

## 2023-08-28 DIAGNOSIS — N401 Enlarged prostate with lower urinary tract symptoms: Secondary | ICD-10-CM

## 2023-08-28 DIAGNOSIS — Z0000 Encounter for general adult medical examination without abnormal findings: Secondary | ICD-10-CM | POA: Diagnosis not present

## 2023-08-28 DIAGNOSIS — Z720 Tobacco use: Secondary | ICD-10-CM

## 2023-08-28 DIAGNOSIS — R350 Frequency of micturition: Secondary | ICD-10-CM

## 2023-08-28 DIAGNOSIS — D539 Nutritional anemia, unspecified: Secondary | ICD-10-CM

## 2023-08-28 DIAGNOSIS — E559 Vitamin D deficiency, unspecified: Secondary | ICD-10-CM

## 2023-08-28 DIAGNOSIS — R252 Cramp and spasm: Secondary | ICD-10-CM

## 2023-08-28 DIAGNOSIS — D538 Other specified nutritional anemias: Secondary | ICD-10-CM

## 2023-08-28 DIAGNOSIS — E785 Hyperlipidemia, unspecified: Secondary | ICD-10-CM

## 2023-08-28 DIAGNOSIS — F1010 Alcohol abuse, uncomplicated: Secondary | ICD-10-CM

## 2023-08-28 DIAGNOSIS — Z0001 Encounter for general adult medical examination with abnormal findings: Secondary | ICD-10-CM

## 2023-08-28 DIAGNOSIS — G629 Polyneuropathy, unspecified: Secondary | ICD-10-CM

## 2023-08-28 DIAGNOSIS — Z23 Encounter for immunization: Secondary | ICD-10-CM

## 2023-08-28 LAB — CBC WITH DIFFERENTIAL/PLATELET
Basophils Absolute: 0 10*3/uL (ref 0.0–0.1)
Basophils Relative: 0.9 % (ref 0.0–3.0)
Eosinophils Absolute: 0.2 10*3/uL (ref 0.0–0.7)
Eosinophils Relative: 6.8 % — ABNORMAL HIGH (ref 0.0–5.0)
HCT: 36.7 % — ABNORMAL LOW (ref 39.0–52.0)
Hemoglobin: 11.8 g/dL — ABNORMAL LOW (ref 13.0–17.0)
Lymphocytes Relative: 36.6 % (ref 12.0–46.0)
Lymphs Abs: 1.3 10*3/uL (ref 0.7–4.0)
MCHC: 32.3 g/dL (ref 30.0–36.0)
MCV: 90.5 fL (ref 78.0–100.0)
Monocytes Absolute: 0.3 10*3/uL (ref 0.1–1.0)
Monocytes Relative: 9.4 % (ref 3.0–12.0)
Neutro Abs: 1.7 10*3/uL (ref 1.4–7.7)
Neutrophils Relative %: 46.3 % (ref 43.0–77.0)
Platelets: 283 10*3/uL (ref 150.0–400.0)
RBC: 4.05 Mil/uL — ABNORMAL LOW (ref 4.22–5.81)
RDW: 14.4 % (ref 11.5–15.5)
WBC: 3.6 10*3/uL — ABNORMAL LOW (ref 4.0–10.5)

## 2023-08-28 LAB — HEPATIC FUNCTION PANEL
ALT: 11 U/L (ref 0–53)
AST: 21 U/L (ref 0–37)
Albumin: 4.2 g/dL (ref 3.5–5.2)
Alkaline Phosphatase: 65 U/L (ref 39–117)
Bilirubin, Direct: 0.1 mg/dL (ref 0.0–0.3)
Total Bilirubin: 0.3 mg/dL (ref 0.2–1.2)
Total Protein: 7.2 g/dL (ref 6.0–8.3)

## 2023-08-28 LAB — BASIC METABOLIC PANEL
BUN: 18 mg/dL (ref 6–23)
CO2: 31 meq/L (ref 19–32)
Calcium: 9.3 mg/dL (ref 8.4–10.5)
Chloride: 102 meq/L (ref 96–112)
Creatinine, Ser: 0.99 mg/dL (ref 0.40–1.50)
GFR: 76.18 mL/min (ref >60.00)
Glucose, Bld: 93 mg/dL (ref 70–99)
Potassium: 4.4 meq/L (ref 3.5–5.1)
Sodium: 139 meq/L (ref 135–145)

## 2023-08-28 LAB — FOLATE: Folate: 11.8 ng/mL (ref >5.9)

## 2023-08-28 LAB — CK: Total CK: 309 U/L — ABNORMAL HIGH (ref 7–232)

## 2023-08-28 LAB — TSH: TSH: 2.1 u[IU]/mL (ref 0.35–5.50)

## 2023-08-28 LAB — MAGNESIUM: Magnesium: 1.6 mg/dL (ref 1.5–2.5)

## 2023-08-28 NOTE — Progress Notes (Signed)
Subjective:  Patient ID: Keith [name], male    DOB: August 30, 1951  Age: 72 y.o. MRN: [id number]  CC: Annual Exam, COPD, and Anemia   HPI Armin Brinlee presents for a CPX and f/up ----  Discussed the use of AI scribe software for clinical note transcription with the patient, who gave verbal consent to proceed.  History of Present Illness   The patient, with a known history of COPD and anemia, presents with ongoing muscle cramps and 'jumping' sensations, which have not ceased but have reportedly slowed down. He denies experiencing chest pain or shortness of breath, attributing his respiratory comfort to the use of an inhaler for COPD management. He has not coughed up any blood or phlegm recently.  He reports a sensation of weakness in his legs but denies any associated pain during ambulation. The weakness is particularly noticeable when attempting to lift his legs while lying down at night.   According to prescription refills he is not taking the statin.      Outpatient Medications Prior to Visit  Medication Sig Dispense Refill   albuterol (VENTOLIN HFA) 108 (90 Base) MCG/ACT inhaler Inhale 2 puffs into the lungs every 6 (six) hours as needed for wheezing or shortness of breath. 8 g 6   Fluticasone-Umeclidin-Vilant (TRELEGY ELLIPTA) 100-62.5-25 MCG/ACT AEPB Inhale 60 each into the lungs daily. 60 each 5   ketoconazole (NIZORAL) 2 % shampoo Apply topically 3 (three) times a week.     mupirocin ointment (BACTROBAN) 2 % Apply topically 2 (two) times daily.     tadalafil (CIALIS) 5 MG tablet Take 5 mg by mouth as needed.     tamsulosin (FLOMAX) 0.4 MG CAPS capsule Take 1 capsule (0.4 mg total) by mouth 2 (two) times daily. 180 capsule 3   tretinoin (RETIN-A) 0.1 % cream 1 application a pearl-sized amount to face in the evening Externally Once a day for 30 days     triamcinolone cream (KENALOG) 0.1 % Apply topically 2 (two) times daily.     Multiple Vitamins-Minerals (SENTRY ADULT PO)  Sentry     rosuvastatin (CRESTOR) 5 MG tablet TAKE 1 TABLET EVERY DAY 90 tablet 1   VITAMIN D3 1.25 MG (50000 UT) capsule TAKE 1 CAPSULE EVERY WEEK 4 capsule 11   zinc gluconate 50 MG tablet Take 1 tablet (50 mg total) by mouth daily. 90 tablet 1   magnesium oxide (MAG-OX) 400 (240 Mg) MG tablet TAKE 1 TABLET EVERY DAY (Patient not taking: Reported on 08/28/2023) 90 tablet 0   No facility-administered medications prior to visit.    ROS Review of Systems  Constitutional:  Negative for appetite change, chills, diaphoresis, fatigue and fever.  HENT: Negative.  Negative for trouble swallowing and voice change.   Eyes: Negative.   Respiratory:  Positive for shortness of breath. Negative for cough, chest tightness and wheezing.   Cardiovascular:  Negative for chest pain, palpitations and leg swelling.  Gastrointestinal:  Negative for abdominal pain, constipation, diarrhea, nausea and vomiting.  Endocrine: Negative.   Genitourinary: Negative.  Negative for difficulty urinating.  Musculoskeletal:  Positive for myalgias. Negative for arthralgias and back pain.  Skin: Negative.  Negative for color change.  Neurological:  Positive for weakness and numbness. Negative for dizziness, light-headedness and headaches.  Hematological:  Negative for adenopathy. Does not bruise/bleed easily.  Psychiatric/Behavioral:  Positive for confusion and decreased concentration. Negative for sleep disturbance. The patient is not nervous/anxious.     Objective:  BP (!) 110/58 (BP Location: Left Arm,  Patient Position: Sitting, Cuff Size: Normal)   Pulse (!) 54   Temp 98.3 F (36.8 C) (Oral)   Resp 16   Ht 5\' 5"  (1.651 m)   Wt 127 lb 9.6 oz (57.9 kg)   SpO2 92%   BMI 21.23 kg/m   BP Readings from Last 3 Encounters:  08/28/23 (!) 110/58  06/11/23 104/60  11/20/22 115/75    Wt Readings from Last 3 Encounters:  08/28/23 127 lb 9.6 oz (57.9 kg)  06/11/23 125 lb 12.8 oz (57.1 kg)  11/20/22 119 lb 14.4 oz  (54.4 kg)    Physical Exam Vitals reviewed.  Eyes:     General: No scleral icterus.    Conjunctiva/sclera: Conjunctivae normal.  Cardiovascular:     Rate and Rhythm: Regular rhythm. Bradycardia present.     Pulses:          Dorsalis pedis pulses are 1+ on the right side and 1+ on the left side.       Posterior tibial pulses are 1+ on the right side and 1+ on the left side.     Heart sounds: No murmur heard.    No friction rub. No gallop.     Comments: EKG- SB, 49 bpm No LVH, Q waves, or ST/T waves  Pulmonary:     Effort: Pulmonary effort is normal.     Breath sounds: No stridor. No wheezing, rhonchi or rales.  Abdominal:     General: Abdomen is flat.     Palpations: There is no mass.     Tenderness: There is no abdominal tenderness. There is no guarding.     Hernia: No hernia is present.  Musculoskeletal:        General: Normal range of motion.     Cervical back: Neck supple.     Right lower leg: No edema.     Left lower leg: No edema.  Feet:     Right foot:     Skin integrity: Skin integrity normal.     Left foot:     Skin integrity: Skin integrity normal.  Lymphadenopathy:     Cervical: No cervical adenopathy.  Skin:    General: Skin is warm and dry.  Neurological:     General: No focal deficit present.     Mental Status: He is alert. Mental status is at baseline.  Psychiatric:        Mood and Affect: Mood normal.        Behavior: Behavior normal.        Lab Results  Component Value Date   WBC 3.6 (L) 08/28/2023   HGB 11.8 (L) 08/28/2023   HCT 36.7 (L) 08/28/2023   PLT 283.0 08/28/2023   GLUCOSE 93 08/28/2023   CHOL 112 04/06/2021   TRIG 105.0 04/06/2021   HDL 54.00 04/06/2021   LDLCALC 37 04/06/2021   ALT 11 08/28/2023   AST 21 08/28/2023   NA 139 08/28/2023   K 4.4 08/28/2023   CL 102 08/28/2023   CREATININE 0.99 08/28/2023   BUN 18 08/28/2023   CO2 31 08/28/2023   TSH 2.10 08/28/2023   PSA 0.9 04/26/2023   INR 1.1 ratio (H) 08/25/2009     No results found.  Assessment & Plan:  Dyslipidemia, goal LDL below 100- He is not complaint with the statin. -     TSH; Future  Bradycardia, sinus- He is asx with this. -     TSH; Future -     EKG 12-Lead  Deficiency  anemia -     Vitamin B1; Future -     Zinc; Future -     Folate; Future -     Reticulocytes; Future -     CBC with Differential/Platelet; Future  Muscle cramping- CK is mildly elevated. -     Hepatic function panel; Future -     Basic metabolic panel; Future -     Magnesium; Future -     CK; Future  Anemia due to zinc deficiency -     Zinc; Future -     CBC with Differential/Platelet; Future -     Zinc Gluconate; Take 1 tablet (50 mg total) by mouth daily.  Dispense: 90 tablet; Refill: 1  Hypomagnesemia syndrome -     Basic metabolic panel; Future -     Magnesium; Future -     Magnesium Oxide -Mg Supplement; Take 1 tablet (400 mg total) by mouth daily.  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 Will discontinue the supplement. -     VITAMIN D 25 Hydroxy (Vit-D Deficiency, Fractures); Future  Benign prostatic hyperplasia with urinary frequency -     Urinalysis, Routine w reflex microscopic; Future  Encounter for general adult medical examination with abnormal findings- Exam completed, labs reviewed, vaccines reviewed, cancer screenings are UTD, pt ed material was given.   Other orders -     EKG     Follow-up: Return in about 6 months (around 02/26/2024).  Sanda Linger, MD

## 2023-08-28 NOTE — Patient Instructions (Signed)
Health Maintenance, Male Adopting a healthy lifestyle and getting preventive care are important in promoting health and wellness. Ask your health care provider about: The right schedule for you to have regular tests and exams. Things you can do on your own to prevent diseases and keep yourself healthy. What should I know about diet, weight, and exercise? Eat a healthy diet  Eat a diet that includes plenty of vegetables, fruits, low-fat dairy products, and lean protein. Do not eat a lot of foods that are high in solid fats, added sugars, or sodium. Maintain a healthy weight Body mass index (BMI) is a measurement that can be used to identify possible weight problems. It estimates body fat based on height and weight. Your health care provider can help determine your BMI and help you achieve or maintain a healthy weight. Get regular exercise Get regular exercise. This is one of the most important things you can do for your health. Most adults should: Exercise for at least 150 minutes each week. The exercise should increase your heart rate and make you sweat (moderate-intensity exercise). Do strengthening exercises at least twice a week. This is in addition to the moderate-intensity exercise. Spend less time sitting. Even light physical activity can be beneficial. Watch cholesterol and blood lipids Have your blood tested for lipids and cholesterol at 72 years of age, then have this test every 5 years. You may need to have your cholesterol levels checked more often if: Your lipid or cholesterol levels are high. You are older than 72 years of age. You are at high risk for heart disease. What should I know about cancer screening? Many types of cancers can be detected early and may often be prevented. Depending on your health history and family history, you may need to have cancer screening at various ages. This may include screening for: Colorectal cancer. Prostate cancer. Skin cancer. Lung  cancer. What should I know about heart disease, diabetes, and high blood pressure? Blood pressure and heart disease High blood pressure causes heart disease and increases the risk of stroke. This is more likely to develop in people who have high blood pressure readings or are overweight. Talk with your health care provider about your target blood pressure readings. Have your blood pressure checked: Every 3-5 years if you are 18-39 years of age. Every year if you are 40 years old or older. If you are between the ages of 65 and 75 and are a current or former smoker, ask your health care provider if you should have a one-time screening for abdominal aortic aneurysm (AAA). Diabetes Have regular diabetes screenings. This checks your fasting blood sugar level. Have the screening done: Once every three years after age 45 if you are at a normal weight and have a low risk for diabetes. More often and at a younger age if you are overweight or have a high risk for diabetes. What should I know about preventing infection? Hepatitis B If you have a higher risk for hepatitis B, you should be screened for this virus. Talk with your health care provider to find out if you are at risk for hepatitis B infection. Hepatitis C Blood testing is recommended for: Everyone born from 1945 through 1965. Anyone with known risk factors for hepatitis C. Sexually transmitted infections (STIs) You should be screened each year for STIs, including gonorrhea and chlamydia, if: You are sexually active and are younger than 72 years of age. You are older than 72 years of age and your   health care provider tells you that you are at risk for this type of infection. Your sexual activity has changed since you were last screened, and you are at increased risk for chlamydia or gonorrhea. Ask your health care provider if you are at risk. Ask your health care provider about whether you are at high risk for HIV. Your health care provider  may recommend a prescription medicine to help prevent HIV infection. If you choose to take medicine to prevent HIV, you should first get tested for HIV. You should then be tested every 3 months for as long as you are taking the medicine. Follow these instructions at home: Alcohol use Do not drink alcohol if your health care provider tells you not to drink. If you drink alcohol: Limit how much you have to 0-2 drinks a day. Know how much alcohol is in your drink. In the [place] one drink equals one 12 oz bottle of beer (355 mL), one 5 oz glass of wine (148 mL), or one 1 oz glass of hard liquor (44 mL). Lifestyle Do not use any products that contain nicotine or tobacco. These products include cigarettes, chewing tobacco, and vaping devices, such as e-cigarettes. If you need help quitting, ask your health care provider. Do not use street drugs. Do not share needles. Ask your health care provider for help if you need support or information about quitting drugs. General instructions Schedule regular health, dental, and eye exams. Stay current with your vaccines. Tell your health care provider if: You often feel depressed. You have ever been abused or do not feel safe at home. Summary Adopting a healthy lifestyle and getting preventive care are important in promoting health and wellness. Follow your health care provider's instructions about healthy diet, exercising, and getting tested or screened for diseases. Follow your health care provider's instructions on monitoring your cholesterol and blood pressure. This information is not intended to replace advice given to you by your health care provider. Make sure you discuss any questions you have with your health care provider. Document Revised: 01/23/2021 Document Reviewed: 01/23/2021 Elsevier Patient Education  2024 Elsevier Inc.  

## 2023-08-29 ENCOUNTER — Telehealth: Payer: Self-pay

## 2023-08-29 LAB — URINALYSIS, ROUTINE W REFLEX MICROSCOPIC
Bilirubin Urine: NEGATIVE
Hgb urine dipstick: NEGATIVE
Ketones, ur: NEGATIVE
Leukocytes,Ua: NEGATIVE
Nitrite: NEGATIVE
RBC / HPF: NONE SEEN (ref 0–?)
Specific Gravity, Urine: 1.02 (ref 1.000–1.030)
Total Protein, Urine: NEGATIVE
Urine Glucose: NEGATIVE
Urobilinogen, UA: 1 (ref 0.0–1.0)
pH: 7 (ref 5.0–8.0)

## 2023-08-29 LAB — VITAMIN D 25 HYDROXY (VIT D DEFICIENCY, FRACTURES): VITD: >120 ng/mL

## 2023-08-29 MED ORDER — ZINC GLUCONATE 50 MG PO TABS: 50.0000 mg | ORAL_TABLET | Freq: Every day | ORAL | 1 refills | Status: AC

## 2023-08-29 MED ORDER — MAGNESIUM OXIDE -MG SUPPLEMENT 400 (240 MG) MG PO TABS: 1.0000 | ORAL_TABLET | Freq: Every day | ORAL | 0 refills | Status: AC

## 2023-08-29 NOTE — Progress Notes (Signed)
The test results show that your current treatment is OK, as the tests are stable.  Please continue the same plan.  There is no other need for change of treatment or further evaluation based on these results, at this time.  thanks 

## 2023-08-29 NOTE — Telephone Encounter (Signed)
CRITICAL VALUE STICKER  CRITICAL VALUE: vitamin D greater than 120  RECEIVER (on-site recipient of call): Dahlia Client  DATE & TIME NOTIFIED: 08/29/23 at 8:47 am  MESSENGER (representative from lab): Perlie Gold  MD NOTIFIED: Dr. Yetta Barre

## 2023-08-29 NOTE — Telephone Encounter (Signed)
Called pt and advised him to stop Vit D per PCP based on lab results. Pt verbalized understanding

## 2023-08-31 ENCOUNTER — Encounter: Payer: Self-pay | Admitting: Internal Medicine

## 2023-08-31 DIAGNOSIS — Z23 Encounter for immunization: Secondary | ICD-10-CM | POA: Insufficient documentation

## 2023-08-31 MED ORDER — ROSUVASTATIN CALCIUM 5 MG PO TABS: 5.0000 mg | ORAL_TABLET | Freq: Every day | ORAL | 1 refills | Status: DC

## 2023-08-31 MED ORDER — SHINGRIX 50 MCG/0.5ML IM SUSR: 0.5000 mL | Freq: Once | INTRAMUSCULAR | 1 refills | Status: AC

## 2023-08-31 MED ORDER — BOOSTRIX 5-2.5-18.5 LF-MCG/0.5 IM SUSP: 0.5000 mL | Freq: Once | INTRAMUSCULAR | 0 refills | Status: AC

## 2023-09-01 LAB — ZINC: Zinc: 69 ug/dL (ref 60–130)

## 2023-09-01 LAB — RETICULOCYTES
ABS Retic: 53040 {cells}/uL (ref 25000–90000)
Retic Ct Pct: 1.3 %

## 2023-09-01 LAB — VITAMIN B1: Vitamin B1 (Thiamine): 9 nmol/L (ref 8–30)

## 2023-09-02 ENCOUNTER — Other Ambulatory Visit: Payer: Self-pay | Admitting: Acute Care

## 2023-09-02 ENCOUNTER — Telehealth: Payer: Self-pay

## 2023-09-02 DIAGNOSIS — F17210 Nicotine dependence, cigarettes, uncomplicated: Secondary | ICD-10-CM

## 2023-09-02 DIAGNOSIS — Z122 Encounter for screening for malignant neoplasm of respiratory organs: Secondary | ICD-10-CM

## 2023-09-02 DIAGNOSIS — Z87891 Personal history of nicotine dependence: Secondary | ICD-10-CM

## 2023-09-02 NOTE — Progress Notes (Unsigned)
   Care Guide Note  09/02/2023 Name: Keith [name] MRN: [id number] DOB: June 03, 1951  Referred by: Etta Grandchild, MD Reason for referral : Care Coordination (Outreach to schedule with pharm d )   Keith [name] is a 72 y.o. year old male who is a primary care patient of Etta Grandchild, MD. Keith [name] was referred to the pharmacist for assistance related to HLD and COPD.    An unsuccessful telephone outreach was attempted today to contact the patient who was referred to the pharmacy team for assistance with medication management. Additional attempts will be made to contact the patient.   Penne Lash , RMA     [facility] Guide  Direct Dial: [phone]  Website: [email or web address]

## 2023-09-05 NOTE — Progress Notes (Signed)
Care Guide Pharmacy Note  09/05/2023 Name: [name] MRN: [id number] DOB: Nov 13, 1950  Referred By: Etta Grandchild, MD Reason for referral: Care Coordination (Outreach to schedule with pharm d )   Keith [name] is a 72 y.o. year old male who is a primary care patient of Etta Grandchild, MD.  [name] was referred to the pharmacist for assistance related to: HLD and COPD  Successful contact was made with the patient to discuss pharmacy services including being ready for the pharmacist to call at least 5 minutes before the scheduled appointment time and to have medication bottles and any blood pressure readings ready for review. The patient agreed to meet with the pharmacist via telephone visit on (date/time).09/20/2023  Penne Lash , RMA     [place]  [facility], [facility] Guide  Direct Dial: [phone]  Website: [email or web address]

## 2023-09-20 ENCOUNTER — Other Ambulatory Visit (INDEPENDENT_AMBULATORY_CARE_PROVIDER_SITE_OTHER): Payer: Medicare HMO | Admitting: Pharmacist

## 2023-09-20 DIAGNOSIS — E785 Hyperlipidemia, unspecified: Secondary | ICD-10-CM

## 2023-09-20 NOTE — Patient Instructions (Signed)
 It was a pleasure speaking with you today!    Feel free to call with any questions or concerns!  Arbutus Leas, PharmD, BCPS [facility] Clinical Pharmacist [facility] [phone]

## 2023-09-20 NOTE — Progress Notes (Signed)
 09/20/2023 Name: Keith [name] MRN: [id number] DOB: 1950/11/29  Chief Complaint  Patient presents with   Hyperlipidemia   Medication Management    Keith [name] is a 73 y.o. year old male who presented for a telephone visit.   They were referred to the pharmacist by their PCP for assistance in managing hyperlipidemia.    Subjective:  Care Team: Primary Care Provider: Joshua Debby CROME, MD ; Next Scheduled Visit: not scheduled  Medication Access/Adherence  Current Pharmacy:  [facility] [phone] - [place], [place] - [address] AT [facility] [address] [place] [address] Phone: [phone] Fax: [phone]   Patient reports affordability concerns with their medications: No  Patient reports access/transportation concerns to their pharmacy: No  Patient reports adherence concerns with their medications:  Yes    Pt notes he ran out of rosuvastatin  for several weeks because he was unable to get a refill from [facility]. He got a refill in December from [facility] and states he is taking it.   Hyperlipidemia/ASCVD Risk Reduction  Current lipid lowering medications: rosuvastatin  5 mg daily  No ASCVD history seen in chart   The ASCVD Risk score (Arnett DK, et al., 2019) failed to calculate for the following reasons:   The valid total cholesterol range is 130 to 320 mg/dL    Objective:  No results found for: HGBA1C  Lab Results  Component Value Date   CREATININE 0.99 08/28/2023   BUN 18 08/28/2023   NA 139 08/28/2023   K 4.4 08/28/2023   CL 102 08/28/2023   CO2 31 08/28/2023    Lab Results  Component Value Date   CHOL 112 04/06/2021   HDL 54.00 04/06/2021   LDLCALC 37 04/06/2021   TRIG 105.0 04/06/2021   CHOLHDL 2 04/06/2021    Medications Reviewed Today     Reviewed by Merceda Lela SAUNDERS, RPH (Pharmacist) on 09/20/23 at 1301  Med List Status: <None>   Medication Order Taking? Sig Documenting  Provider Last Dose Status Informant  albuterol  (VENTOLIN  HFA) 108 (90 Base) MCG/ACT inhaler [id number] Yes Inhale 2 puffs into the lungs every 6 (six) hours as needed for wheezing or shortness of breath. Kara Dorn NOVAK, MD Taking Active   Fluticasone -Umeclidin-Vilant (TRELEGY ELLIPTA ) 100-62.5-25 MCG/ACT AEPB [id number] Yes Inhale 60 each into the lungs daily. Kara Dorn NOVAK, MD Taking Active   ketoconazole (NIZORAL) 2 % shampoo [id number]  Apply topically 3 (three) times a week. [provider]  Active   magnesium  oxide (MAG-OX) 400 (240 Mg) MG tablet [id number] Yes Take 1 tablet (400 mg total) by mouth daily. Joshua Debby CROME, MD Taking Active   mupirocin ointment (BACTROBAN) 2 % [id number]  Apply topically 2 (two) times daily. [provider]  Active   rosuvastatin  (CRESTOR ) 5 MG tablet [id number] Yes Take 5 mg by mouth daily. Joshua Debby CROME, MD Taking Active Self  tadalafil (CIALIS) 5 MG tablet [id number]  Take 5 mg by mouth as needed. [provider]  Active   tamsulosin  (FLOMAX ) 0.4 MG CAPS capsule [id number] Yes Take 1 capsule (0.4 mg total) by mouth 2 (two) times daily. Patrcia Cough, MD Taking Active   tretinoin (RETIN-A) 0.1 % cream [id number]  1 application a pearl-sized amount to face in the evening Externally Once a day for 30 days [provider]  Active   triamcinolone cream (KENALOG) 0.1 % [id number] Yes Apply topically 2 (two) times daily. [provider] Taking Active  zinc  gluconate 50 MG tablet [id number] Yes Take 1 tablet (50 mg total) by mouth daily. Joshua Debby CROME, MD Taking Active               Assessment/Plan:   Hyperlipidemia/ASCVD Risk Reduction: - Currently controlled. Goal LDL <100. Need updated lipid panel. - Recommend to continue rosuvastatin .   He notes his legs jump but did not notice a difference when he was off of rosuvastatin . Can try Vitamin B12.   Follow Up Plan: PRN  Darrelyn Drum, PharmD, BCPS,  CPP Clinical Pharmacist Practitioner [facility] [phone]

## 2023-09-27 ENCOUNTER — Ambulatory Visit (INDEPENDENT_AMBULATORY_CARE_PROVIDER_SITE_OTHER): Payer: Medicaid Other

## 2023-09-27 VITALS — Ht 65.0 in | Wt 127.0 lb

## 2023-09-27 DIAGNOSIS — Z0000 Encounter for general adult medical examination without abnormal findings: Secondary | ICD-10-CM

## 2023-09-27 NOTE — Progress Notes (Addendum)
 Subjective:   Keith [name] is a 73 y.o. male who presents for Medicare Annual/Subsequent preventive examination.  Visit Complete: Virtual I connected with  Keith [name] on 09/27/23 by a audio enabled telemedicine application and verified that I am speaking with the correct person using two identifiers.  Patient Location: Home  Provider Location: Office/Clinic  I discussed the limitations of evaluation and management by telemedicine. The patient expressed understanding and agreed to proceed.  Vital Signs: Because this visit was a virtual/telehealth visit, some criteria may be missing or patient reported. Any vitals not documented were not able to be obtained and vitals that have been documented are patient reported.   Cardiac Risk Factors include: male gender;advanced age (>69men, >66 women);Other (see comment);dyslipidemia, Risk factor comments: OPD, Malignant neoplasm of prostate,  BPH     Objective:    Today's Vitals   09/27/23 1412  Weight: 127 lb (57.6 kg)  Height: 5' 5 (1.651 m)   Body mass index is 21.13 kg/m.     09/27/2023    2:20 PM 11/20/2022    7:00 AM 11/05/2022   10:32 AM 09/20/2022    2:07 PM 06/23/2021    9:01 AM  Advanced Directives  Does Patient Have a Medical Advance Directive? No No No No No  Would patient like information on creating a medical advance directive?  Yes (MAU/Ambulatory/Procedural Areas - Information given)  No - Patient declined     Current Medications (verified) Outpatient Encounter Medications as of 09/27/2023  Medication Sig   albuterol  (VENTOLIN  HFA) 108 (90 Base) MCG/ACT inhaler Inhale 2 puffs into the lungs every 6 (six) hours as needed for wheezing or shortness of breath.   cyanocobalamin  (VITAMIN B12) 500 MCG tablet Take 1,000 mcg by mouth daily.   Fluticasone -Umeclidin-Vilant (TRELEGY ELLIPTA ) 100-62.5-25 MCG/ACT AEPB Inhale 60 each into the lungs daily.   ketoconazole (NIZORAL) 2 % shampoo Apply topically 3 (three) times  a week.   magnesium  oxide (MAG-OX) 400 (240 Mg) MG tablet Take 1 tablet (400 mg total) by mouth daily.   mupirocin ointment (BACTROBAN) 2 % Apply topically 2 (two) times daily.   rosuvastatin  (CRESTOR ) 5 MG tablet Take 5 mg by mouth daily.   tadalafil (CIALIS) 5 MG tablet Take 5 mg by mouth as needed.   tamsulosin  (FLOMAX ) 0.4 MG CAPS capsule Take 1 capsule (0.4 mg total) by mouth 2 (two) times daily.   tretinoin (RETIN-A) 0.1 % cream 1 application a pearl-sized amount to face in the evening Externally Once a day for 30 days   triamcinolone cream (KENALOG) 0.1 % Apply topically 2 (two) times daily.   zinc  gluconate 50 MG tablet Take 1 tablet (50 mg total) by mouth daily.   No facility-administered encounter medications on file as of 09/27/2023.    Allergies (verified) Patient has no known allergies.   History: Past Medical History:  Diagnosis Date   Cancer (HCC)    Chicken pox    COPD (chronic obstructive pulmonary disease) (HCC)    patient denies   Hyperlipidemia    on meds   Hypertension    Past Surgical History:  Procedure Laterality Date   COLONOSCOPY  2009   Dr.Orr-F/V-prep unknown-tortuous sigm colon-   GOLD SEED IMPLANT N/A 11/20/2022   Procedure: GOLD SEED IMPLANT;  Surgeon: Rosalind Zachary NOVAK, MD;  Location: [facility];  Service: Urology;  Laterality: N/A;  30 MINS   SPACE OAR INSTILLATION N/A 11/20/2022   Procedure: SPACE OAR INSTILLATION;  Surgeon: Rosalind Zachary NOVAK,  MD;  Location: [facility];  Service: Urology;  Laterality: N/A;   Family History  Problem Relation Age of Onset   Hypertension Mother    Heart attack Mother        No details   Hypertension Father    Heart attack Father        No details   Heart attack Sister        No details   Pancreatic cancer Brother    Alcohol abuse Brother    Alcohol abuse Brother    Hypertension Brother    Lung cancer Brother    Colon polyps Neg Hx    Colon cancer Neg Hx    Esophageal cancer  Neg Hx    Stomach cancer Neg Hx    Rectal cancer Neg Hx    Social History   Socioeconomic History   Marital status: Married    Spouse name: [place]    Number of children: 2   Years of education: Not on file   Highest education level: Not on file  Occupational History   Occupation: RETIRED  Tobacco Use   Smoking status: Some Days    Current packs/day: 0.00    Average packs/day: 0.5 packs/day for 53.4 years (26.7 ttl pk-yrs)    Types: Cigarettes    Start date: 02/15/1969    Last attempt to quit: 07/04/2022    Years since quitting: 1.2   Smokeless tobacco: Never   Tobacco comments:    Smokes about 3-4 cigarettes per day  Vaping Use   Vaping status: Never Used  Substance and Sexual Activity   Alcohol use: Yes    Alcohol/week: 2.0 standard drinks of alcohol    Types: 2 Standard drinks or equivalent per week    Comment: Patient quit drinking 03/2021   Drug use: Never   Sexual activity: Not Currently    Partners: Female  Other Topics Concern   Not on file  Social History Narrative   Lives with wife, retired.  2 children and one grand.     Lives in a one story    Right Handed, but patient can use left    Social Drivers of Health   Financial Resource Strain: Medium Risk (09/27/2023)   Overall Financial Resource Strain (CARDIA)    Difficulty of Paying Living Expenses: Somewhat hard  Food Insecurity: No Food Insecurity (09/27/2023)   Hunger Vital Sign    Worried About Running Out of Food in the Last Year: Never true    Ran Out of Food in the Last Year: Never true  Transportation Needs: No Transportation Needs (09/27/2023)   PRAPARE - Administrator, Civil Service (Medical): No    Lack of Transportation (Non-Medical): No  Physical Activity: Insufficiently Active (09/27/2023)   Exercise Vital Sign    Days of Exercise per Week: 3 days    Minutes of Exercise per Session: 40 min  Stress: No Stress Concern Present (09/27/2023)   [employer] of Occupational Health -  Occupational Stress Questionnaire    Feeling of Stress : Not at all  Social Connections: Moderately Integrated (09/27/2023)   Social Connection and Isolation Panel [NHANES]    Frequency of Communication with Friends and Family: Twice a week    Frequency of Social Gatherings with Friends and Family: Once a week    Attends Religious Services: 1 to 4 times per year    Active Member of [employer] or Organizations: No    Attends Banker Meetings: Never  Marital Status: Married    Tobacco Counseling Ready to quit: Not Answered Counseling given: Not Answered Tobacco comments: Smokes about 3-4 cigarettes per day   Clinical Intake:  Pre-visit preparation completed: Yes  Pain : No/denies pain     Diabetes: No  How often do you need to have someone help you when you read instructions, pamphlets, or other written materials from your doctor or pharmacy?: 1 - Never  Interpreter Needed?: No  Information entered by :: Brighid Koch, RMA   Activities of Daily Living    09/27/2023    2:14 PM 11/20/2022    7:08 AM  In your present state of health, do you have any difficulty performing the following activities:  Hearing? 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 0 0  Difficulty concentrating or making decisions? 0 0  Walking or climbing stairs? 1 1  Dressing or bathing? 0 0  Doing errands, shopping? 0   Preparing Food and eating ? N   Using the Toilet? N   In the past six months, have you accidently leaked urine? N   Do you have problems with loss of bowel control? N   Managing your Medications? N   Managing your Finances? N   Housekeeping or managing your Housekeeping? N     Patient Care Team: Joshua Debby CROME, MD as PCP - General (Internal Medicine) Tobie Tonita POUR, DO as Consulting Physician (Neurology) Szabat, Toribio BROCKS, [facility] (Inactive) as Pharmacist (Pharmacist) Vertell Pont, RN as Oncology Nurse Navigator Rosalind Zachary NOVAK, MD (Inactive) as Consulting Physician  (Urology) Patrcia Cough, MD as Consulting Physician (Radiation Oncology) Starla Wendelyn BIRCH, RN as Registered Nurse  Indicate any recent Medical Services you may have received from other than [facility] providers in the past year (date may be approximate).     Assessment:   This is a routine wellness examination for Keith [name].  Hearing/Vision screen Hearing Screening - Comments:: Has hearing aides-not comfortable to wear Vision Screening - Comments:: Wear eyeglasses for reading   Goals Addressed   None   Depression Screen    09/27/2023    2:24 PM 08/28/2023    2:44 PM 09/20/2022    1:49 PM 04/06/2021    1:35 PM  PHQ 2/9 Scores  PHQ - 2 Score 2 0 0 0  PHQ- 9 Score 4       Fall Risk    09/27/2023    2:20 PM 08/28/2023    2:44 PM 09/20/2022    2:08 PM 06/23/2021    9:00 AM 04/06/2021    1:35 PM  Fall Risk   Falls in the past year? 0 0 0 0 1  Number falls in past yr: 0 0 0 0 0  Injury with Fall? 0 0 0 0 0  Risk for fall due to : No Fall Risks No Fall Risks No Fall Risks    Follow up Falls prevention discussed;Falls evaluation completed Falls evaluation completed Falls prevention discussed      MEDICARE RISK AT HOME: Medicare Risk at Home Any stairs in or around the home?: No Home free of loose throw rugs in walkways, pet beds, electrical cords, etc?: Yes Adequate lighting in your home to reduce risk of falls?: Yes Life alert?: No Use of a cane, walker or w/c?: Yes Grab bars in the bathroom?: No Shower chair or bench in shower?: No Elevated toilet seat or a handicapped toilet?: No  TIMED UP AND GO:  Was the test performed?  No    Cognitive Function:        09/27/2023    2:21 PM 09/20/2022    1:50 PM  6CIT Screen  What Year? 0 points 0 points  What month? 0 points 0 points  What time? 0 points 0 points  Count back from 20 0 points 0 points  Months in reverse 0 points 0 points  Repeat phrase 0 points 0 points  Total Score 0 points 0 points    Immunizations Immunization  History  Administered Date(s) Administered   Fluad Quad(high Dose 65+) 06/07/2021, 07/04/2023   PFIZER Comirnaty(Gray Top)Covid-19 Tri-Sucrose Vaccine 07/04/2023   PFIZER(Purple Top)SARS-COV-2 Vaccination 10/23/2019, 11/13/2019, 06/27/2020   PNEUMOCOCCAL CONJUGATE-20 04/06/2021   Pfizer Covid-19 Vaccine Bivalent Booster 5y-11y 07/04/2023   Unspecified SARS-COV-2 Vaccination 07/04/2023    TDAP status: Due, Education has been provided regarding the importance of this vaccine. Advised may receive this vaccine at local pharmacy or Health Dept. Aware to provide a copy of the vaccination record if obtained from local pharmacy or Health Dept. Verbalized acceptance and understanding.  Flu Vaccine status: Up to date  Pneumococcal vaccine status: Up to date  Covid-19 vaccine status: Declined, Education has been provided regarding the importance of this vaccine but patient still declined. Advised may receive this vaccine at local pharmacy or Health Dept.or vaccine clinic. Aware to provide a copy of the vaccination record if obtained from local pharmacy or Health Dept. Verbalized acceptance and understanding.  Qualifies for Shingles Vaccine? Yes   Zostavax completed No   Shingrix  Completed?: No.    Education has been provided regarding the importance of this vaccine. Patient has been advised to call insurance company to determine out of pocket expense if they have not yet received this vaccine. Advised may also receive vaccine at local pharmacy or Health Dept. Verbalized acceptance and understanding.  Screening Tests Health Maintenance  Topic Date Due   Lung Cancer Screening  10/11/2023   COVID-19 Vaccine (5 - 2024-25 season) 10/13/2023 (Originally 08/29/2023)   Zoster Vaccines- Shingrix  (1 of 2) 01/15/2024 (Originally 04/15/1970)   DTaP/Tdap/Td (1 - Tdap) 02/14/2024 (Originally 04/15/1970)   Colonoscopy  06/14/2031   Pneumonia Vaccine 96+ Years old  Completed   INFLUENZA VACCINE  Completed    Hepatitis C Screening  Completed   HPV VACCINES  Aged Out    Health Maintenance  Health Maintenance Due  Topic Date Due   Lung Cancer Screening  10/11/2023    Colorectal cancer screening: Type of screening: Colonoscopy. Completed 06/13/2021. Repeat every 10 years  Lung Cancer Screening: (Low Dose CT Chest recommended if Age 55-80 years, 20 pack-year currently smoking OR have quit w/in 15years.) does qualify.   Lung Cancer Screening Referral: 10/14/2023  Additional Screening:  Hepatitis C Screening: does qualify; Completed 04/06/2021  Vision Screening: Recommended annual ophthalmology exams for early detection of glaucoma and other disorders of the eye. Is the patient up to date with their annual eye exam?  Yes  Who is the provider or what is the name of the office in which the patient attends annual eye exams? N/A If pt is not established with a provider, would they like to be referred to a provider to establish care? Yes .   Dental Screening: Recommended annual dental exams for proper oral hygiene   Community Resource Referral / Chronic Care Management: CRR required this visit?  No   CCM required this visit?  No     Plan:     I have personally reviewed and  noted the following in the patient's chart:   Medical and social history Use of alcohol, tobacco or illicit drugs  Current medications and supplements including opioid prescriptions. Patient is not currently taking opioid prescriptions. Functional ability and status Nutritional status Physical activity Advanced directives List of other physicians Hospitalizations, surgeries, and ER visits in previous 12 months Vitals Screenings to include cognitive, depression, and falls Referrals and appointments  In addition, I have reviewed and discussed with patient certain preventive protocols, quality metrics, and best practice recommendations. A written personalized care plan for preventive services as well as general  preventive health recommendations were provided to patient.     Clemmie Marxen L Chane Magner, CMA   09/27/2023   After Visit Summary: (Mail) Due to this being a telephonic visit, the after visit summary with patients personalized plan was offered to patient via mail   Nurse Notes: Patient is due for a Shingles vaccine and a Tdap.  Patient is also due for a yearly eye exam and would like a referral for one.  He had no other concerns to address today.

## 2023-09-27 NOTE — Patient Instructions (Signed)
 Keith [name] , Thank you for taking time to come for your Medicare Wellness Visit. I appreciate your ongoing commitment to your health goals. Please review the following plan we discussed and let me know if I can assist you in the future.   Referrals/Orders/Follow-Ups/Clinician Recommendations: You are due for a Tetanus vaccine and a Shingles vaccine.  Remember to ask about your eye doctor referral during your next visit with Dr. Joshua.  Each day, aim for 6 glasses of water, plenty of protein in your diet and try to get up and walk/ stretch every hour for 5-10 minutes at a time.    This is a list of the screening recommended for you and due dates:  Health Maintenance  Topic Date Due   Screening for Lung Cancer  10/11/2023   COVID-19 Vaccine (5 - 2024-25 season) 10/13/2023*   Zoster (Shingles) Vaccine (1 of 2) 01/15/2024*   DTaP/Tdap/Td vaccine (1 - Tdap) 02/14/2024*   Colon Cancer Screening  06/14/2031   Pneumonia Vaccine  Completed   Flu Shot  Completed   Hepatitis C Screening  Completed   HPV Vaccine  Aged Out  *Topic was postponed. The date shown is not the original due date.    Advanced directives: (Provided) Advance directive discussed with you today. I have provided a copy for you to complete at home and have notarized. Once this is complete, please bring a copy in to our office so we can scan it into your chart.   Next Medicare Annual Wellness Visit scheduled for next year: Yes

## 2023-10-14 ENCOUNTER — Ambulatory Visit
Admission: RE | Admit: 2023-10-14 | Discharge: 2023-10-14 | Disposition: A | Discharge status: Home / Self Care | Payer: Medicare HMO | Source: Ambulatory Visit | Attending: Acute Care | Admitting: Acute Care

## 2023-10-14 DIAGNOSIS — F17210 Nicotine dependence, cigarettes, uncomplicated: Secondary | ICD-10-CM | POA: Diagnosis not present

## 2023-10-14 DIAGNOSIS — Z122 Encounter for screening for malignant neoplasm of respiratory organs: Secondary | ICD-10-CM

## 2023-10-14 DIAGNOSIS — Z87891 Personal history of nicotine dependence: Secondary | ICD-10-CM

## 2023-10-21 ENCOUNTER — Telehealth: Payer: Self-pay | Admitting: Acute Care

## 2023-10-21 NOTE — Telephone Encounter (Signed)
  IMPRESSION: 1. Lung-RADS 4A, suspicious. Follow up low-dose chest CT without contrast in 3 months (please use the following order, "CT CHEST LCS NODULE FOLLOW-UP W/O CM") is recommended. Medial right upper lobe 7.3 mm pulmonary nodule, new since the prior. 2. Aortic atherosclerosis (ICD10-I70.0), coronary artery atherosclerosis and emphysema (ICD10-J43.9). 3. Left nephrolithiasis

## 2023-10-21 NOTE — Telephone Encounter (Signed)
Tiffany calling with call report. For CT scan.Tiffany phone number is [phone]. 

## 2023-10-25 ENCOUNTER — Other Ambulatory Visit: Payer: Self-pay

## 2023-10-25 NOTE — Telephone Encounter (Signed)
 Copied from CRM [id number]. Topic: Clinical - Lab/Test Results >> Oct 25, 2023  1:11 PM [name] wrote: Reason for CRM: patient called in to get information regarding his lung CT scan that he ad had done on 10/14/2023/He stated that he hasn't heard from anyone regarding the results.

## 2023-10-25 NOTE — Telephone Encounter (Signed)
 Spoke with patient and reviewed results. Reviewed results with Ruthell, NP prior to call. There is a new nodule 7.3 mm since last scan. A 3 month f/u scan is recommended. Reviewed emphysema, aortic arthrosclerosis and coronary artery atherosclerosis. He is on statin therapy. Results and plan sent to PCP. 3 month order placed. Patient agrees with plan. No additional questions.

## 2023-11-04 DIAGNOSIS — C61 Malignant neoplasm of prostate: Secondary | ICD-10-CM | POA: Diagnosis not present

## 2023-11-11 DIAGNOSIS — Z8546 Personal history of malignant neoplasm of prostate: Secondary | ICD-10-CM | POA: Diagnosis not present

## 2023-11-11 DIAGNOSIS — N401 Enlarged prostate with lower urinary tract symptoms: Secondary | ICD-10-CM | POA: Diagnosis not present

## 2023-11-11 DIAGNOSIS — R350 Frequency of micturition: Secondary | ICD-10-CM | POA: Diagnosis not present

## 2023-12-17 ENCOUNTER — Other Ambulatory Visit: Payer: Self-pay

## 2023-12-17 ENCOUNTER — Telehealth: Payer: Self-pay | Admitting: Acute Care

## 2023-12-17 DIAGNOSIS — Z87891 Personal history of nicotine dependence: Secondary | ICD-10-CM

## 2023-12-17 DIAGNOSIS — R911 Solitary pulmonary nodule: Secondary | ICD-10-CM

## 2023-12-17 NOTE — Telephone Encounter (Signed)
 Returned call from VM to schedule follow up LCS CT.  Phone rang twice and then someone answered and hung up.

## 2024-01-13 ENCOUNTER — Ambulatory Visit
Admission: RE | Admit: 2024-01-13 | Discharge: 2024-01-13 | Disposition: A | Discharge status: Home / Self Care | Source: Ambulatory Visit | Attending: Acute Care | Admitting: Acute Care

## 2024-01-13 DIAGNOSIS — R911 Solitary pulmonary nodule: Secondary | ICD-10-CM

## 2024-01-13 DIAGNOSIS — Z87891 Personal history of nicotine dependence: Secondary | ICD-10-CM

## 2024-02-05 ENCOUNTER — Other Ambulatory Visit: Payer: Self-pay | Admitting: Acute Care

## 2024-02-05 DIAGNOSIS — Z87891 Personal history of nicotine dependence: Secondary | ICD-10-CM

## 2024-02-05 DIAGNOSIS — Z122 Encounter for screening for malignant neoplasm of respiratory organs: Secondary | ICD-10-CM

## 2024-02-05 DIAGNOSIS — F17210 Nicotine dependence, cigarettes, uncomplicated: Secondary | ICD-10-CM

## 2024-02-24 ENCOUNTER — Other Ambulatory Visit: Payer: Self-pay | Admitting: Family Medicine

## 2024-02-24 DIAGNOSIS — Z136 Encounter for screening for cardiovascular disorders: Secondary | ICD-10-CM

## 2024-02-26 ENCOUNTER — Ambulatory Visit
Admission: RE | Admit: 2024-02-26 | Discharge: 2024-02-26 | Discharge status: Home / Self Care | Source: Ambulatory Visit | Attending: Family Medicine

## 2024-02-26 DIAGNOSIS — Z136 Encounter for screening for cardiovascular disorders: Secondary | ICD-10-CM

## 2024-03-02 ENCOUNTER — Other Ambulatory Visit: Payer: Self-pay | Admitting: Pulmonary Disease

## 2024-03-02 ENCOUNTER — Other Ambulatory Visit: Payer: Self-pay | Admitting: Internal Medicine

## 2024-03-02 DIAGNOSIS — J432 Centrilobular emphysema: Secondary | ICD-10-CM

## 2024-03-03 ENCOUNTER — Other Ambulatory Visit: Payer: Self-pay | Admitting: Internal Medicine

## 2024-04-01 ENCOUNTER — Other Ambulatory Visit: Payer: Self-pay | Admitting: Pulmonary Disease

## 2024-04-01 ENCOUNTER — Other Ambulatory Visit: Payer: Self-pay | Admitting: Internal Medicine

## 2024-04-29 ENCOUNTER — Other Ambulatory Visit: Payer: Self-pay | Admitting: Internal Medicine

## 2024-06-05 ENCOUNTER — Other Ambulatory Visit: Payer: Self-pay | Admitting: Pulmonary Disease

## 2024-06-05 NOTE — Telephone Encounter (Signed)
 Pt will need an appt for any further refills
